# Patient Record
Sex: Male | Born: 1955 | Race: White | Hispanic: No | Marital: Married | Smoking: Never smoker
Health system: Southern US, Community
[De-identification: ages and names within clinical notes are randomized; demographics above are authoritative.]

## PROBLEM LIST (undated history)

## (undated) DIAGNOSIS — D3A019 Benign carcinoid tumor of the small intestine, unspecified portion: Secondary | ICD-10-CM

## (undated) DIAGNOSIS — K432 Incisional hernia without obstruction or gangrene: Secondary | ICD-10-CM

## (undated) DIAGNOSIS — E119 Type 2 diabetes mellitus without complications: Secondary | ICD-10-CM

## (undated) DIAGNOSIS — B3781 Candidal esophagitis: Secondary | ICD-10-CM

## (undated) DIAGNOSIS — K219 Gastro-esophageal reflux disease without esophagitis: Secondary | ICD-10-CM

## (undated) DIAGNOSIS — C439 Malignant melanoma of skin, unspecified: Secondary | ICD-10-CM

## (undated) DIAGNOSIS — R519 Headache, unspecified: Secondary | ICD-10-CM

## (undated) DIAGNOSIS — K222 Esophageal obstruction: Secondary | ICD-10-CM

## (undated) DIAGNOSIS — G8929 Other chronic pain: Secondary | ICD-10-CM

## (undated) DIAGNOSIS — I10 Essential (primary) hypertension: Secondary | ICD-10-CM

## (undated) DIAGNOSIS — M1990 Unspecified osteoarthritis, unspecified site: Secondary | ICD-10-CM

## (undated) DIAGNOSIS — K635 Polyp of colon: Secondary | ICD-10-CM

## (undated) DIAGNOSIS — E785 Hyperlipidemia, unspecified: Secondary | ICD-10-CM

## (undated) DIAGNOSIS — R51 Headache: Secondary | ICD-10-CM

## (undated) HISTORY — DX: Unspecified osteoarthritis, unspecified site: M19.90

## (undated) HISTORY — DX: Gastro-esophageal reflux disease without esophagitis: K21.9

## (undated) HISTORY — DX: Malignant melanoma of skin, unspecified: C43.9

## (undated) HISTORY — PX: COLON RESECTION: SHX5231

## (undated) HISTORY — DX: Other chronic pain: G89.29

## (undated) HISTORY — DX: Headache: R51

## (undated) HISTORY — DX: Hyperlipidemia, unspecified: E78.5

## (undated) HISTORY — DX: Headache, unspecified: R51.9

## (undated) HISTORY — DX: Essential (primary) hypertension: I10

## (undated) HISTORY — DX: Esophageal obstruction: K22.2

## (undated) HISTORY — DX: Type 2 diabetes mellitus without complications: E11.9

## (undated) HISTORY — DX: Incisional hernia without obstruction or gangrene: K43.2

## (undated) HISTORY — DX: Candidal esophagitis: B37.81

## (undated) HISTORY — DX: Polyp of colon: K63.5

## (undated) HISTORY — PX: HERNIA REPAIR: SHX51

## (undated) HISTORY — PX: OTHER SURGICAL HISTORY: SHX169

## (undated) HISTORY — DX: Benign carcinoid tumor of the small intestine, unspecified portion: D3A.019

---

## 1993-12-23 DIAGNOSIS — C439 Malignant melanoma of skin, unspecified: Secondary | ICD-10-CM

## 1993-12-23 HISTORY — DX: Malignant melanoma of skin, unspecified: C43.9

## 2000-05-29 ENCOUNTER — Ambulatory Visit (HOSPITAL_COMMUNITY): Admission: RE | Admit: 2000-05-29 | Discharge: 2000-05-29 | Payer: Self-pay | Admitting: Oncology

## 2000-05-29 ENCOUNTER — Encounter: Payer: Self-pay | Admitting: Oncology

## 2000-11-07 ENCOUNTER — Encounter: Admission: RE | Admit: 2000-11-07 | Discharge: 2000-11-07 | Payer: Self-pay | Admitting: Oncology

## 2000-11-07 ENCOUNTER — Encounter: Payer: Self-pay | Admitting: Oncology

## 2000-11-12 ENCOUNTER — Encounter: Payer: Self-pay | Admitting: Oncology

## 2000-11-12 ENCOUNTER — Encounter: Admission: RE | Admit: 2000-11-12 | Discharge: 2000-11-12 | Payer: Self-pay | Admitting: Oncology

## 2000-11-17 ENCOUNTER — Encounter: Admission: RE | Admit: 2000-11-17 | Discharge: 2000-11-17 | Payer: Self-pay | Admitting: Oncology

## 2000-11-17 ENCOUNTER — Encounter: Payer: Self-pay | Admitting: Oncology

## 2000-11-21 ENCOUNTER — Encounter: Payer: Self-pay | Admitting: Oncology

## 2000-11-21 ENCOUNTER — Encounter: Admission: RE | Admit: 2000-11-21 | Discharge: 2000-11-21 | Payer: Self-pay | Admitting: Oncology

## 2000-11-25 ENCOUNTER — Encounter: Payer: Self-pay | Admitting: Oncology

## 2000-11-25 ENCOUNTER — Ambulatory Visit (HOSPITAL_COMMUNITY): Admission: RE | Admit: 2000-11-25 | Discharge: 2000-11-25 | Payer: Self-pay | Admitting: Oncology

## 2001-01-22 ENCOUNTER — Inpatient Hospital Stay (HOSPITAL_COMMUNITY): Admission: RE | Admit: 2001-01-22 | Discharge: 2001-01-23 | Payer: Self-pay | Admitting: Surgery

## 2001-09-15 ENCOUNTER — Ambulatory Visit (HOSPITAL_COMMUNITY): Admission: RE | Admit: 2001-09-15 | Discharge: 2001-09-15 | Payer: Self-pay | Admitting: Oncology

## 2001-09-15 ENCOUNTER — Encounter: Payer: Self-pay | Admitting: Oncology

## 2002-03-09 ENCOUNTER — Encounter: Payer: Self-pay | Admitting: Oncology

## 2002-03-09 ENCOUNTER — Ambulatory Visit (HOSPITAL_COMMUNITY): Admission: RE | Admit: 2002-03-09 | Discharge: 2002-03-09 | Payer: Self-pay | Admitting: Oncology

## 2002-10-04 ENCOUNTER — Encounter: Payer: Self-pay | Admitting: Oncology

## 2002-10-04 ENCOUNTER — Ambulatory Visit (HOSPITAL_COMMUNITY): Admission: RE | Admit: 2002-10-04 | Discharge: 2002-10-04 | Payer: Self-pay | Admitting: Oncology

## 2003-06-14 ENCOUNTER — Encounter: Payer: Self-pay | Admitting: Oncology

## 2003-06-14 ENCOUNTER — Ambulatory Visit (HOSPITAL_COMMUNITY): Admission: RE | Admit: 2003-06-14 | Discharge: 2003-06-14 | Payer: Self-pay | Admitting: Oncology

## 2003-06-16 ENCOUNTER — Encounter: Admission: RE | Admit: 2003-06-16 | Discharge: 2003-06-16 | Payer: Self-pay | Admitting: Internal Medicine

## 2004-06-05 ENCOUNTER — Ambulatory Visit (HOSPITAL_COMMUNITY): Admission: RE | Admit: 2004-06-05 | Discharge: 2004-06-05 | Payer: Self-pay | Admitting: Oncology

## 2005-03-04 ENCOUNTER — Ambulatory Visit: Payer: Self-pay | Admitting: Oncology

## 2005-06-24 ENCOUNTER — Ambulatory Visit: Payer: Self-pay | Admitting: Oncology

## 2005-06-26 ENCOUNTER — Ambulatory Visit (HOSPITAL_COMMUNITY): Admission: RE | Admit: 2005-06-26 | Discharge: 2005-06-26 | Payer: Self-pay | Admitting: Oncology

## 2005-07-03 ENCOUNTER — Ambulatory Visit (HOSPITAL_COMMUNITY): Admission: RE | Admit: 2005-07-03 | Discharge: 2005-07-03 | Payer: Self-pay | Admitting: Oncology

## 2005-12-24 ENCOUNTER — Ambulatory Visit: Payer: Self-pay | Admitting: Oncology

## 2005-12-25 ENCOUNTER — Ambulatory Visit (HOSPITAL_COMMUNITY): Admission: RE | Admit: 2005-12-25 | Discharge: 2005-12-25 | Payer: Self-pay | Admitting: Oncology

## 2006-01-09 ENCOUNTER — Ambulatory Visit (HOSPITAL_COMMUNITY): Admission: RE | Admit: 2006-01-09 | Discharge: 2006-01-09 | Payer: Self-pay | Admitting: Oncology

## 2006-01-13 ENCOUNTER — Ambulatory Visit: Payer: Self-pay | Admitting: Gastroenterology

## 2006-02-03 ENCOUNTER — Ambulatory Visit: Payer: Self-pay | Admitting: Gastroenterology

## 2006-06-23 ENCOUNTER — Ambulatory Visit: Payer: Self-pay | Admitting: Oncology

## 2006-06-23 LAB — CBC WITH DIFFERENTIAL/PLATELET
EOS%: 1.6 % (ref 0.0–7.0)
Eosinophils Absolute: 0.1 10*3/uL (ref 0.0–0.5)
LYMPH%: 25 % (ref 14.0–48.0)
MCH: 30.5 pg (ref 28.0–33.4)
MCHC: 34.1 g/dL (ref 32.0–35.9)
MCV: 89.4 fL (ref 81.6–98.0)
MONO%: 10.1 % (ref 0.0–13.0)
Platelets: 227 10*3/uL (ref 145–400)
RBC: 4.57 10*6/uL (ref 4.20–5.71)
RDW: 13.6 % (ref 11.2–14.6)

## 2006-06-23 LAB — COMPREHENSIVE METABOLIC PANEL
ALT: 43 U/L — ABNORMAL HIGH (ref 0–40)
AST: 24 U/L (ref 0–37)
Alkaline Phosphatase: 38 U/L — ABNORMAL LOW (ref 39–117)
Calcium: 9.4 mg/dL (ref 8.4–10.5)
Chloride: 102 mEq/L (ref 96–112)
Creatinine, Ser: 1.29 mg/dL (ref 0.40–1.50)

## 2006-06-30 LAB — 5 HIAA, QUANTITATIVE, URINE, 24 HOUR
5-HIAA, 24 Hr Urine: 5 mg/d (ref 0–15)
Creatinine, Urine mg/day: 2535 mg/d — ABNORMAL HIGH (ref 1000–2500)
Creatinine, Urine-mg/dL-5HIAA: 156 mg/dL
Interpretation: NORMAL
Time-5HIAA: 24 hr
Volume, Urine-5HIAA: 1625 mL

## 2006-06-30 LAB — CREATININE CLEARANCE, URINE, 24 HOUR
Collection Interval-CRCL: 24 hours
Creatinine Clearance: 158 mL/min — ABNORMAL HIGH (ref 75–125)

## 2006-12-18 ENCOUNTER — Ambulatory Visit: Payer: Self-pay | Admitting: Oncology

## 2006-12-24 ENCOUNTER — Ambulatory Visit (HOSPITAL_COMMUNITY): Admission: RE | Admit: 2006-12-24 | Discharge: 2006-12-24 | Payer: Self-pay | Admitting: Oncology

## 2006-12-24 LAB — COMPREHENSIVE METABOLIC PANEL
ALT: 76 U/L — ABNORMAL HIGH (ref 0–53)
CO2: 29 mEq/L (ref 19–32)
Calcium: 9.7 mg/dL (ref 8.4–10.5)
Chloride: 99 mEq/L (ref 96–112)
Creatinine, Ser: 1.13 mg/dL (ref 0.40–1.50)
Glucose, Bld: 262 mg/dL — ABNORMAL HIGH (ref 70–99)
Total Bilirubin: 0.8 mg/dL (ref 0.3–1.2)

## 2006-12-24 LAB — CBC WITH DIFFERENTIAL/PLATELET
Basophils Absolute: 0.1 10*3/uL (ref 0.0–0.1)
Eosinophils Absolute: 0.1 10*3/uL (ref 0.0–0.5)
HCT: 43.1 % (ref 38.7–49.9)
HGB: 14.9 g/dL (ref 13.0–17.1)
LYMPH%: 20.6 % (ref 14.0–48.0)
MONO#: 1 10*3/uL — ABNORMAL HIGH (ref 0.1–0.9)
NEUT#: 4 10*3/uL (ref 1.5–6.5)
NEUT%: 61.1 % (ref 40.0–75.0)
Platelets: 226 10*3/uL (ref 145–400)
WBC: 6.6 10*3/uL (ref 4.0–10.0)
lymph#: 1.4 10*3/uL (ref 0.9–3.3)

## 2006-12-24 LAB — LACTATE DEHYDROGENASE: LDH: 224 U/L (ref 94–250)

## 2007-01-08 ENCOUNTER — Ambulatory Visit (HOSPITAL_COMMUNITY): Admission: RE | Admit: 2007-01-08 | Discharge: 2007-01-08 | Payer: Self-pay | Admitting: Oncology

## 2007-06-19 ENCOUNTER — Ambulatory Visit: Payer: Self-pay | Admitting: Oncology

## 2007-06-23 ENCOUNTER — Ambulatory Visit (HOSPITAL_COMMUNITY): Admission: RE | Admit: 2007-06-23 | Discharge: 2007-06-23 | Payer: Self-pay | Admitting: Oncology

## 2007-06-23 LAB — LACTATE DEHYDROGENASE: LDH: 183 U/L (ref 94–250)

## 2007-06-23 LAB — CBC WITH DIFFERENTIAL/PLATELET
BASO%: 0.4 % (ref 0.0–2.0)
Basophils Absolute: 0 10*3/uL (ref 0.0–0.1)
Eosinophils Absolute: 0.1 10*3/uL (ref 0.0–0.5)
HCT: 43.1 % (ref 38.7–49.9)
HGB: 15 g/dL (ref 13.0–17.1)
MONO#: 0.8 10*3/uL (ref 0.1–0.9)
NEUT#: 4.2 10*3/uL (ref 1.5–6.5)
NEUT%: 64.8 % (ref 40.0–75.0)
Platelets: 204 10*3/uL (ref 145–400)
WBC: 6.4 10*3/uL (ref 4.0–10.0)
lymph#: 1.3 10*3/uL (ref 0.9–3.3)

## 2007-06-23 LAB — COMPREHENSIVE METABOLIC PANEL
ALT: 36 U/L (ref 0–53)
BUN: 24 mg/dL — ABNORMAL HIGH (ref 6–23)
CO2: 27 mEq/L (ref 19–32)
Calcium: 9.5 mg/dL (ref 8.4–10.5)
Chloride: 104 mEq/L (ref 96–112)
Creatinine, Ser: 1.07 mg/dL (ref 0.40–1.50)
Glucose, Bld: 141 mg/dL — ABNORMAL HIGH (ref 70–99)

## 2007-06-30 LAB — UA PROTEIN, DIPSTICK - CHCC: Protein, Urine: NEGATIVE mg/dL

## 2007-08-16 IMAGING — CT CT ABDOMEN W/ CM
2 of 5 series · 15 of 46 positions shown, 17 images · IV contrast (omnipaque)
Comparison: Report of a prior study in May 2000.

CLINICAL DATA: Carcinoid lesion and melanoma.  Elevated liver function studies. 
ABDOMEN CT WITH CONTRAST:
TECHNIQUE: Multidetector CT imaging of the abdomen was performed following the standard protocol during bolus administration of intravenous contrast.  This patient has a known history of contrast allergy.  He was premedicated with steroids and Benadryl per our routine 13 hour prep.  Several days earlier he was premedicated with a different protocol, but that procedure was canceled and he was brought in this morning after our routine 13 hour prep.  Despite the prep, the patient developed a significant urticarial reaction over his face and chest following the procedure.  We were successful in completing the scan.  
I was asked to see Mr. Chaviano at that time.  He had numerous hives over his face and chest.  Conjunctiva were injected and red.  He was not short of breath.  Vital signs were stable with blood pressure about 130/108 and heart rate 110 beats per minute.  On physical exam he has no wheezing.  The patient was given an additional 25 mg of Benadryl orally, and was observed in our department for several hours until all symptoms had resolved.  
Mr. Chaviano stated to us that each time he has had IV contrast for CT, he has had a similar reaction.  He has had a total of four allergic reactions to contrast.  The only other time we have scanned him, was in May 2000 when we documented an urticarial reaction similar to today?s .  He stated that on one occasion in the past, he also had shortness of breath.  
This patient clearly has a significant allergy to IV contrast.  Although he has not had a major reaction, he has the propensity to do so.  I think it would be prudent not to give this patient IV contrast in the future, unless it is absolutely necessary.  If the decision is made that he needs contrast, it should be done in a hospital setting, during first shift when full facilities are available.  Although the routine 13 hour prep has been shown to decrease minor reactions, I know of no studies showing that any pre-med protocol can reduce or eliminate the risk of major reactions.  This was explained to the patient.  I also spoke with Dr. Believers and made him aware of the patient?s contrast reaction. 
Contrast:  Oral contrast and 125 cc Omnipaque 300
TECHNIQUE: Multidetector CT imaging of the pelvis was performed following the standard protocol during bolus administration of intravenous contrast.

[Series 2: abd_pel 5.0 b40s · axial · 0.93mm/px · z∈[-594,-144]mm · 12 of 101 slices shown, 14 images]
[im 6/101  soft-tissue]
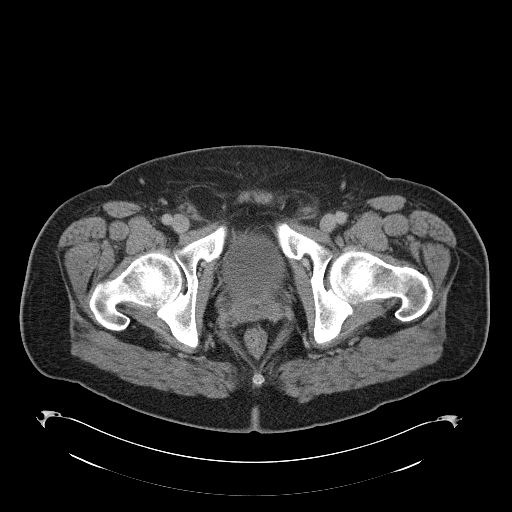
[im 6/101  bone]
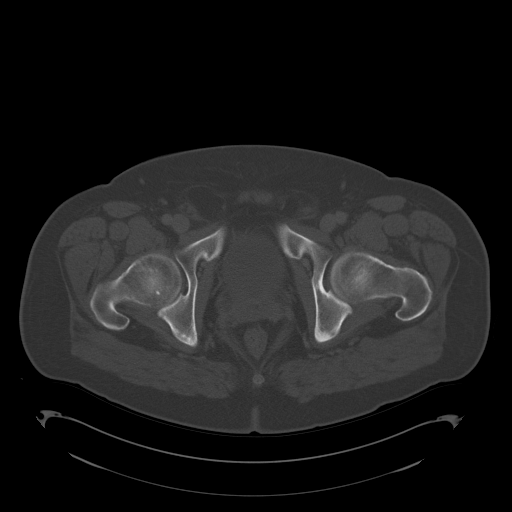
[im 16/101  soft-tissue]
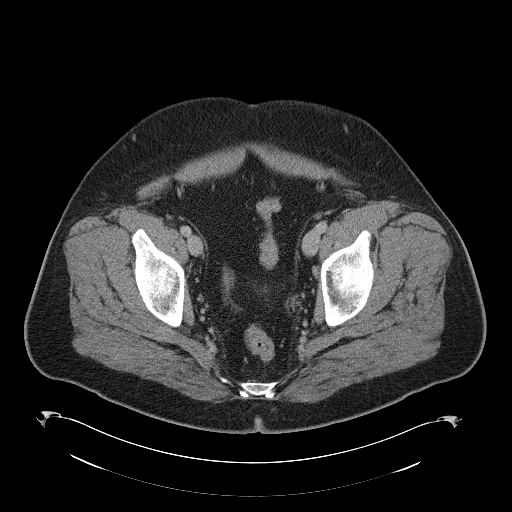
[im 21/101  soft-tissue]
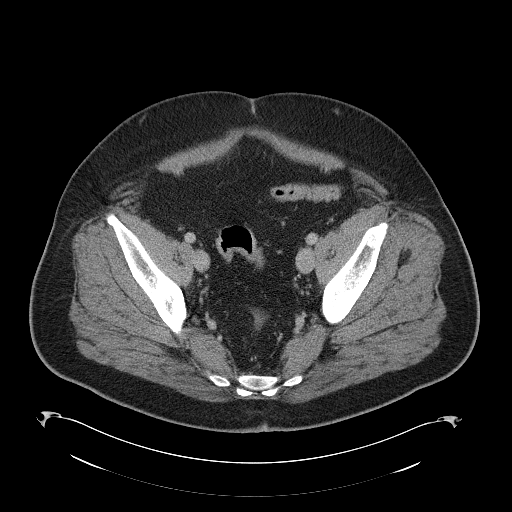
[im 31/101  soft-tissue]
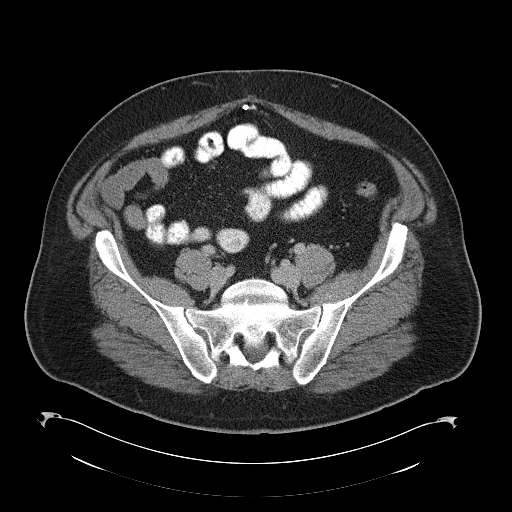
[im 41/101  soft-tissue]
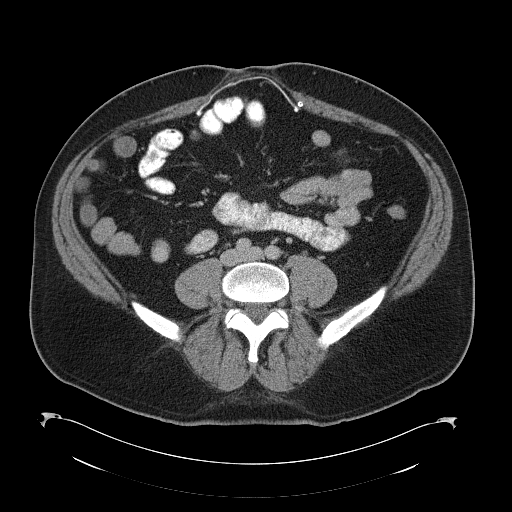
[im 46/101  soft-tissue]
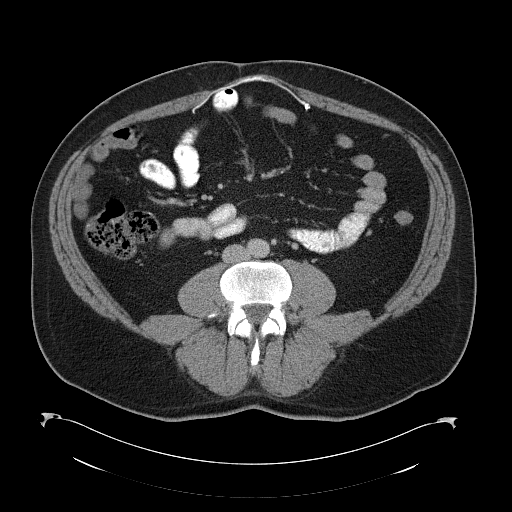
[im 56/101  soft-tissue]
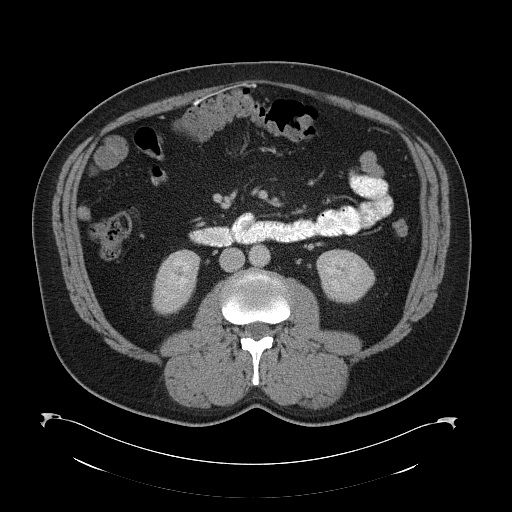
[im 61/101  soft-tissue]
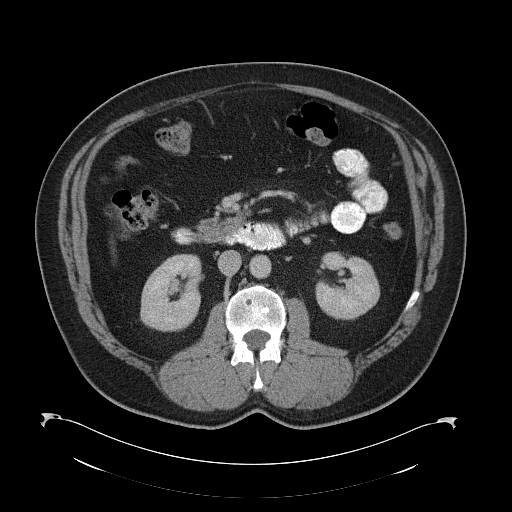
[im 71/101  soft-tissue]
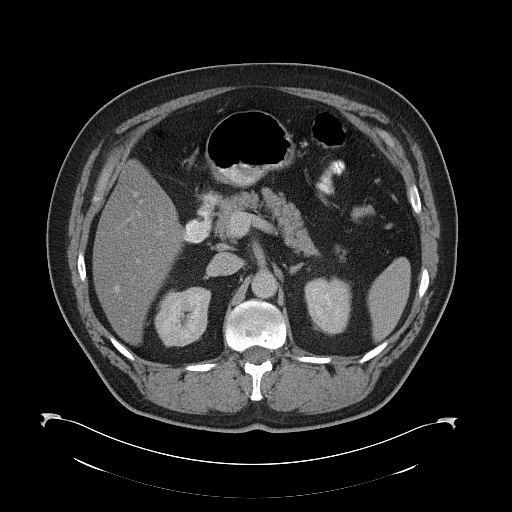
[im 71/101  bone]
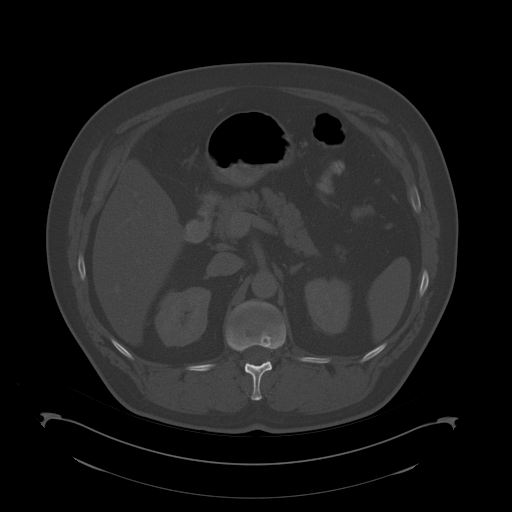
[im 81/101  soft-tissue]
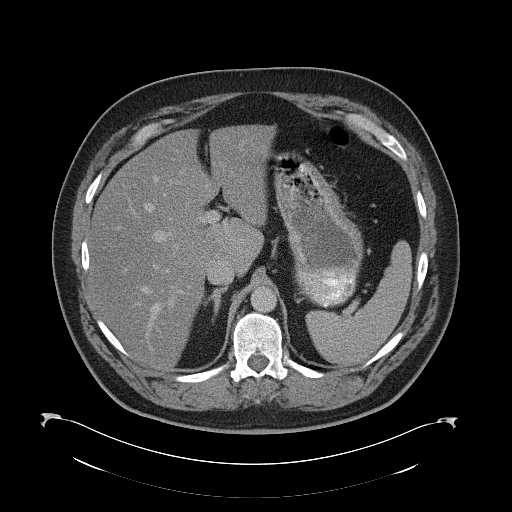
[im 86/101  soft-tissue]
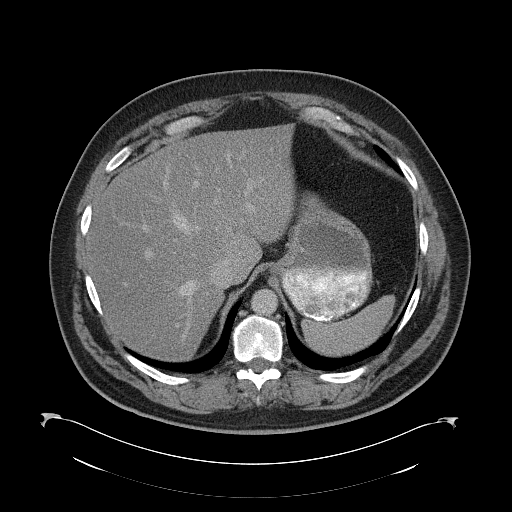
[im 96/101  soft-tissue]
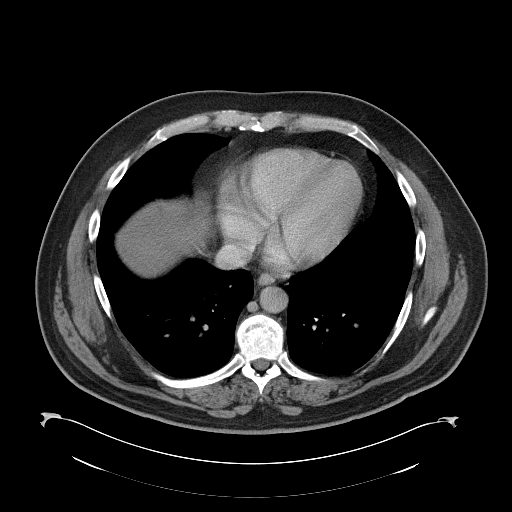

[Series 602: coronal images · coronal · 0.98mm/px · 3 of 103 slices shown]
[im 35/103  soft-tissue]
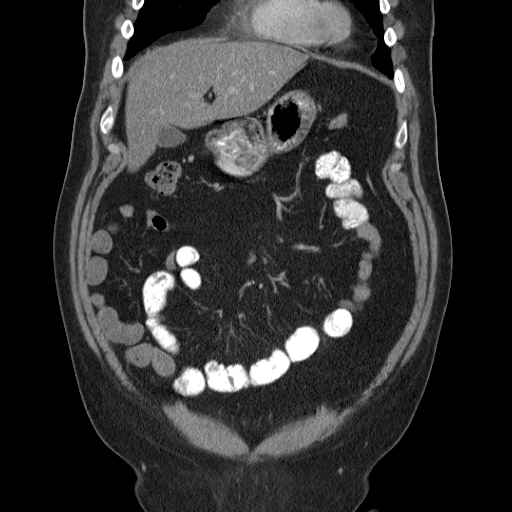
[im 46/103  soft-tissue]
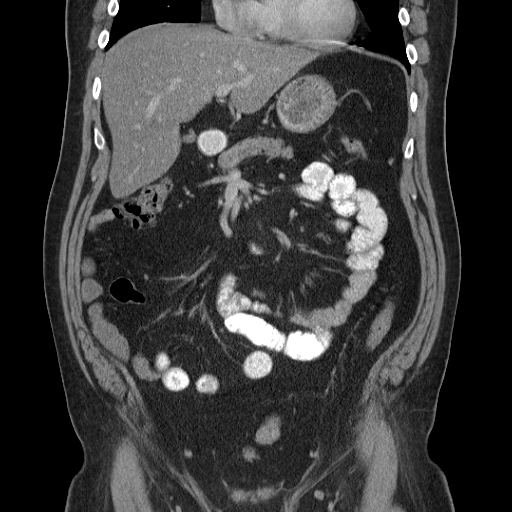
[im 57/103  soft-tissue]
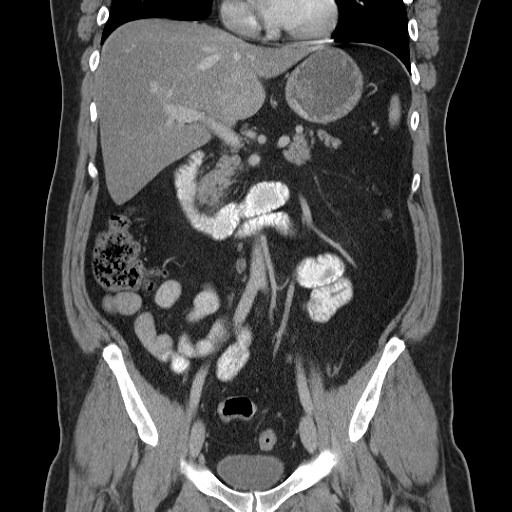

[15 of 46 positions shown; findings below may reference images not displayed]

FINDINGS: Highest cuts include the lung bases which are clear.  There is diffuse fatty infiltration of the liver which was described on the prior study in 0112.  There are no focal lesions.  The spleen, pancreas, and adrenal glands normal.  Early and delayed images through the kidneys show no masses or obstruction.  No adenopathy or ascites.  
There are multiple Schmorl's node lesions in the spine as previously reported.
IMPRESSION: 1.  Normal except for fatty infiltration of the liver. 
2.  Contrast reaction despite premedication ? see above discussion under technique. 
PELVIS CT WITH CONTRAST:
FINDINGS: No focal masses, adenopathy, or fluid collections.  Pelvic side walls and presacral space normal.  No lesions of the bony pelvis.  Appendix not visualized.  There is anterior ventral hernia repair with mesh spanning the midline.  Also noted is a fat-filled right inguinal hernia.
IMPRESSION: No evidence for metastatic disease or other significant findings ? the patient has had a prior ventral hernia repair.

## 2008-06-21 ENCOUNTER — Ambulatory Visit: Payer: Self-pay | Admitting: Oncology

## 2008-06-23 ENCOUNTER — Ambulatory Visit (HOSPITAL_COMMUNITY): Admission: RE | Admit: 2008-06-23 | Discharge: 2008-06-23 | Payer: Self-pay | Admitting: Oncology

## 2008-06-23 LAB — CBC WITH DIFFERENTIAL/PLATELET
BASO%: 0.6 % (ref 0.0–2.0)
Basophils Absolute: 0 10*3/uL (ref 0.0–0.1)
EOS%: 2.2 % (ref 0.0–7.0)
HGB: 14.4 g/dL (ref 13.0–17.1)
MCH: 29.8 pg (ref 28.0–33.4)
MONO%: 13.7 % — ABNORMAL HIGH (ref 0.0–13.0)
RBC: 4.86 10*6/uL (ref 4.20–5.71)
RDW: 14.2 % (ref 11.2–14.6)
lymph#: 2.1 10*3/uL (ref 0.9–3.3)

## 2008-06-23 LAB — COMPREHENSIVE METABOLIC PANEL
ALT: 32 U/L (ref 0–53)
AST: 24 U/L (ref 0–37)
Albumin: 4.8 g/dL (ref 3.5–5.2)
Alkaline Phosphatase: 39 U/L (ref 39–117)
BUN: 24 mg/dL — ABNORMAL HIGH (ref 6–23)
Calcium: 9.8 mg/dL (ref 8.4–10.5)
Chloride: 104 mEq/L (ref 96–112)
Potassium: 3.9 mEq/L (ref 3.5–5.3)
Sodium: 142 mEq/L (ref 135–145)
Total Protein: 7.3 g/dL (ref 6.0–8.3)

## 2008-06-28 ENCOUNTER — Encounter: Payer: Self-pay | Admitting: Gastroenterology

## 2008-12-22 ENCOUNTER — Ambulatory Visit: Payer: Self-pay | Admitting: Oncology

## 2008-12-27 ENCOUNTER — Ambulatory Visit (HOSPITAL_COMMUNITY): Admission: RE | Admit: 2008-12-27 | Discharge: 2008-12-27 | Payer: Self-pay | Admitting: Oncology

## 2008-12-27 LAB — CBC WITH DIFFERENTIAL/PLATELET
Eosinophils Absolute: 0.1 10*3/uL (ref 0.0–0.5)
HCT: 42 % (ref 38.7–49.9)
HGB: 14.3 g/dL (ref 13.0–17.1)
LYMPH%: 26.3 % (ref 14.0–48.0)
MONO#: 0.8 10*3/uL (ref 0.1–0.9)
NEUT#: 3.8 10*3/uL (ref 1.5–6.5)
NEUT%: 59.2 % (ref 40.0–75.0)
Platelets: 215 10*3/uL (ref 145–400)
WBC: 6.4 10*3/uL (ref 4.0–10.0)

## 2008-12-29 LAB — CHROMOGRANIN A: Chromogranin A: <1.5 ng/mL (ref <=36.4)

## 2008-12-29 LAB — COMPREHENSIVE METABOLIC PANEL
CO2: 29 mEq/L (ref 19–32)
Calcium: 9.2 mg/dL (ref 8.4–10.5)
Chloride: 105 mEq/L (ref 96–112)
Creatinine, Ser: 1.06 mg/dL (ref 0.40–1.50)
Glucose, Bld: 75 mg/dL (ref 70–99)
Total Bilirubin: 0.5 mg/dL (ref 0.3–1.2)
Total Protein: 7 g/dL (ref 6.0–8.3)

## 2008-12-29 LAB — LACTATE DEHYDROGENASE: LDH: 200 U/L (ref 94–250)

## 2009-01-03 ENCOUNTER — Encounter: Payer: Self-pay | Admitting: Gastroenterology

## 2009-12-21 ENCOUNTER — Ambulatory Visit: Payer: Self-pay | Admitting: Oncology

## 2009-12-26 ENCOUNTER — Ambulatory Visit (HOSPITAL_COMMUNITY): Admission: RE | Admit: 2009-12-26 | Discharge: 2009-12-26 | Payer: Self-pay | Admitting: Oncology

## 2009-12-26 LAB — CBC WITH DIFFERENTIAL/PLATELET
Eosinophils Absolute: 0.1 10*3/uL (ref 0.0–0.5)
MONO#: 0.6 10*3/uL (ref 0.1–0.9)
MONO%: 11.1 % (ref 0.0–14.0)
NEUT#: 3.1 10*3/uL (ref 1.5–6.5)
RBC: 4.52 10*6/uL (ref 4.20–5.82)
RDW: 13.9 % (ref 11.0–14.6)
WBC: 5.3 10*3/uL (ref 4.0–10.3)
lymph#: 1.5 10*3/uL (ref 0.9–3.3)

## 2010-01-02 LAB — COMPREHENSIVE METABOLIC PANEL
Albumin: 4.3 g/dL (ref 3.5–5.2)
Alkaline Phosphatase: 39 U/L (ref 39–117)
CO2: 28 mEq/L (ref 19–32)
Chloride: 102 mEq/L (ref 96–112)
Glucose, Bld: 69 mg/dL — ABNORMAL LOW (ref 70–99)
Potassium: 4.1 mEq/L (ref 3.5–5.3)
Sodium: 142 mEq/L (ref 135–145)
Total Protein: 6.7 g/dL (ref 6.0–8.3)

## 2010-01-02 LAB — CHROMOGRANIN A

## 2010-01-08 ENCOUNTER — Encounter: Payer: Self-pay | Admitting: Gastroenterology

## 2010-01-23 ENCOUNTER — Telehealth: Payer: Self-pay | Admitting: Gastroenterology

## 2010-01-25 ENCOUNTER — Ambulatory Visit: Payer: Self-pay | Admitting: Gastroenterology

## 2010-01-25 ENCOUNTER — Ambulatory Visit (HOSPITAL_COMMUNITY): Admission: RE | Admit: 2010-01-25 | Discharge: 2010-01-25 | Payer: Self-pay | Admitting: Gastroenterology

## 2010-01-25 DIAGNOSIS — C7A029 Malignant carcinoid tumor of the large intestine, unspecified portion: Secondary | ICD-10-CM

## 2010-01-25 DIAGNOSIS — R1084 Generalized abdominal pain: Secondary | ICD-10-CM | POA: Insufficient documentation

## 2010-01-25 DIAGNOSIS — R112 Nausea with vomiting, unspecified: Secondary | ICD-10-CM | POA: Insufficient documentation

## 2010-01-25 DIAGNOSIS — E1149 Type 2 diabetes mellitus with other diabetic neurological complication: Secondary | ICD-10-CM

## 2010-01-26 LAB — CONVERTED CEMR LAB
AST: 28 units/L (ref 0–37)
BUN: 17 mg/dL (ref 6–23)
Basophils Absolute: 0 10*3/uL (ref 0.0–0.1)
Basophils Relative: 0.9 % (ref 0.0–3.0)
Bilirubin, Direct: 0.2 mg/dL (ref 0.0–0.3)
Creatinine, Ser: 1.3 mg/dL (ref 0.4–1.5)
Eosinophils Absolute: 0.1 10*3/uL (ref 0.0–0.7)
Folate: 14.9 ng/mL
GFR calc non Af Amer: 61.7 mL/min (ref >60)
Iron: 80 ug/dL (ref 42–165)
MCHC: 33.1 g/dL (ref 30.0–36.0)
MCV: 93.5 fL (ref 78.0–100.0)
Monocytes Absolute: 0.5 10*3/uL (ref 0.1–1.0)
Neutrophils Relative %: 59.6 % (ref 43.0–77.0)
Potassium: 3.6 meq/L (ref 3.5–5.1)
RBC: 4.33 M/uL (ref 4.22–5.81)
RDW: 12.9 % (ref 11.5–14.6)
Sed Rate: 10 mm/hr (ref 0–22)
TSH: 1.62 microintl units/mL (ref 0.35–5.50)
Total Bilirubin: 0.6 mg/dL (ref 0.3–1.2)
Total CK: 555 units/L — ABNORMAL HIGH (ref 7–232)
Transferrin: 256.5 mg/dL (ref 212.0–360.0)
Vitamin B-12: 334 pg/mL (ref 211–911)

## 2010-01-29 ENCOUNTER — Encounter: Payer: Self-pay | Admitting: Gastroenterology

## 2010-01-30 ENCOUNTER — Encounter (INDEPENDENT_AMBULATORY_CARE_PROVIDER_SITE_OTHER): Payer: Self-pay | Admitting: *Deleted

## 2010-01-30 ENCOUNTER — Ambulatory Visit: Payer: Self-pay | Admitting: Gastroenterology

## 2010-01-30 DIAGNOSIS — C4359 Malignant melanoma of other part of trunk: Secondary | ICD-10-CM | POA: Insufficient documentation

## 2010-01-31 ENCOUNTER — Ambulatory Visit: Payer: Self-pay | Admitting: Gastroenterology

## 2010-02-02 ENCOUNTER — Encounter: Payer: Self-pay | Admitting: Gastroenterology

## 2010-02-15 ENCOUNTER — Telehealth: Payer: Self-pay | Admitting: Gastroenterology

## 2010-07-19 ENCOUNTER — Encounter: Admission: RE | Admit: 2010-07-19 | Discharge: 2010-07-19 | Payer: Self-pay | Admitting: Family Medicine

## 2010-12-27 ENCOUNTER — Ambulatory Visit: Payer: Self-pay | Admitting: Oncology

## 2010-12-31 ENCOUNTER — Ambulatory Visit (HOSPITAL_COMMUNITY)
Admission: RE | Admit: 2010-12-31 | Discharge: 2010-12-31 | Payer: Self-pay | Source: Home / Self Care | Attending: Oncology | Admitting: Oncology

## 2010-12-31 LAB — CBC WITH DIFFERENTIAL/PLATELET
BASO%: 0.9 % (ref 0.0–2.0)
Basophils Absolute: 0 10*3/uL (ref 0.0–0.1)
EOS%: 1.8 % (ref 0.0–7.0)
Eosinophils Absolute: 0.1 10*3/uL (ref 0.0–0.5)
HCT: 42.2 % (ref 38.4–49.9)
HGB: 14.4 g/dL (ref 13.0–17.1)
LYMPH%: 24.4 % (ref 14.0–49.0)
MCH: 30 pg (ref 27.2–33.4)
MCHC: 34.2 g/dL (ref 32.0–36.0)
MCV: 87.7 fL (ref 79.3–98.0)
MONO#: 0.5 10*3/uL (ref 0.1–0.9)
MONO%: 10.6 % (ref 0.0–14.0)
NEUT#: 3.2 10*3/uL (ref 1.5–6.5)
NEUT%: 62.3 % (ref 39.0–75.0)
Platelets: 191 10*3/uL (ref 140–400)
RBC: 4.81 10*6/uL (ref 4.20–5.82)
RDW: 13.2 % (ref 11.0–14.6)
WBC: 5.1 10*3/uL (ref 4.0–10.3)
lymph#: 1.2 10*3/uL (ref 0.9–3.3)

## 2011-01-04 LAB — COMPREHENSIVE METABOLIC PANEL
ALT: 21 U/L (ref 0–53)
AST: 19 U/L (ref 0–37)
Albumin: 4.3 g/dL (ref 3.5–5.2)
Alkaline Phosphatase: 45 U/L (ref 39–117)
BUN: 20 mg/dL (ref 6–23)
CO2: 26 mEq/L (ref 19–32)
Calcium: 9.6 mg/dL (ref 8.4–10.5)
Chloride: 101 mEq/L (ref 96–112)
Creatinine, Ser: 1.08 mg/dL (ref 0.40–1.50)
Glucose, Bld: 134 mg/dL — ABNORMAL HIGH (ref 70–99)
Potassium: 4.1 mEq/L (ref 3.5–5.3)
Sodium: 138 mEq/L (ref 135–145)
Total Bilirubin: 0.5 mg/dL (ref 0.3–1.2)
Total Protein: 6.6 g/dL (ref 6.0–8.3)

## 2011-01-04 LAB — CHROMOGRANIN A: Chromogranin A: 8.6 ng/mL (ref 1.9–15.0)

## 2011-01-04 LAB — LACTATE DEHYDROGENASE: LDH: 174 U/L (ref 94–250)

## 2011-01-13 ENCOUNTER — Encounter: Payer: Self-pay | Admitting: Oncology

## 2011-01-15 ENCOUNTER — Encounter: Payer: Self-pay | Admitting: Gastroenterology

## 2011-01-15 ENCOUNTER — Inpatient Hospital Stay (HOSPITAL_COMMUNITY)
Admission: RE | Admit: 2011-01-15 | Discharge: 2011-01-17 | Payer: Self-pay | Source: Home / Self Care | Attending: Oncology | Admitting: Oncology

## 2011-01-16 LAB — COMPREHENSIVE METABOLIC PANEL WITH GFR
ALT: 29 U/L (ref 0–53)
AST: 19 U/L (ref 0–37)
Albumin: 3.6 g/dL (ref 3.5–5.2)
Alkaline Phosphatase: 48 U/L (ref 39–117)
BUN: 22 mg/dL (ref 6–23)
CO2: 29 meq/L (ref 19–32)
Calcium: 9.5 mg/dL (ref 8.4–10.5)
Chloride: 101 meq/L (ref 96–112)
Creatinine, Ser: 1.14 mg/dL (ref 0.4–1.5)
GFR calc non Af Amer: >60 mL/min
Glucose, Bld: 202 mg/dL — ABNORMAL HIGH (ref 70–99)
Potassium: 4 meq/L (ref 3.5–5.1)
Sodium: 136 meq/L (ref 135–145)
Total Bilirubin: 0.8 mg/dL (ref 0.3–1.2)
Total Protein: 6.5 g/dL (ref 6.0–8.3)

## 2011-01-16 LAB — GLUCOSE, CAPILLARY
Glucose-Capillary: 181 mg/dL — ABNORMAL HIGH (ref 70–99)
Glucose-Capillary: 218 mg/dL — ABNORMAL HIGH (ref 70–99)
Glucose-Capillary: 207 mg/dL — ABNORMAL HIGH (ref 70–99)
Glucose-Capillary: 215 mg/dL — ABNORMAL HIGH (ref 70–99)
Glucose-Capillary: 209 mg/dL — ABNORMAL HIGH (ref 70–99)

## 2011-01-16 LAB — HEMOGLOBIN A1C
Hgb A1c MFr Bld: 7.2 % — ABNORMAL HIGH
Mean Plasma Glucose: 160 mg/dL — ABNORMAL HIGH

## 2011-01-16 LAB — CBC
HCT: 40.6 % (ref 39.0–52.0)
Hemoglobin: 13.7 g/dL (ref 13.0–17.0)
MCH: 29.5 pg (ref 26.0–34.0)
MCHC: 33.7 g/dL (ref 30.0–36.0)
MCV: 87.5 fL (ref 78.0–100.0)
Platelets: 201 K/uL (ref 150–400)
RBC: 4.64 MIL/uL (ref 4.22–5.81)
RDW: 13.4 % (ref 11.5–15.5)
WBC: 5.9 K/uL (ref 4.0–10.5)

## 2011-01-16 LAB — TSH: TSH: 2.21 u[IU]/mL (ref 0.350–4.500)

## 2011-01-16 LAB — VITAMIN B12: Vitamin B-12: 374 pg/mL (ref 211–911)

## 2011-01-16 LAB — RPR: RPR Ser Ql: NONREACTIVE

## 2011-01-16 LAB — FOLATE: Folate: 14.6 ng/mL

## 2011-01-17 LAB — GLUCOSE, CAPILLARY: Glucose-Capillary: 216 mg/dL — ABNORMAL HIGH (ref 70–99)

## 2011-01-17 LAB — LIPID PANEL
HDL: 42 mg/dL (ref >39)
Total CHOL/HDL Ratio: 4.2 RATIO
Triglycerides: 135 mg/dL (ref <150)
VLDL: 27 mg/dL (ref 0–40)

## 2011-01-24 NOTE — Letter (Signed)
Summary: Diabetic Instructions  [facility]  [address] [place], [place] [address]   Phone: [phone]  Fax: [phone]    [name] 05/11/1956 MRN: [id number]   _X  _   ORAL DIABETIC MEDICATION INSTRUCTIONS  The day before your procedure:   Take your diabetic pill as you do normally  The day of your procedure:   Do not take your diabetic pill    We will check your blood sugar levels during the admission process and again in Recovery before discharging you home  ________________________________________________________________________  _ X _   INSULIN (LONG ACTING) MEDICATION INSTRUCTIONS (Lantus, NPH, 70/30, Humulin, Novolin-N)   The day before your procedure:   Take  your regular evening dose    The day of your procedure:   Do not take your morning dose    _  _   INSULIN (SHORT ACTING) MEDICATION INSTRUCTIONS (Regular, Humulog, Novolog)   The day before your procedure:   Do not take your evening dose   The day of your procedure:   Do not take your morning dose   _  _   INSULIN PUMP MEDICATION INSTRUCTIONS  We will contact the physician managing your diabetic care for written dosage instructions for the day before your procedure and the day of your procedure.  Once we have received the instructions, we will contact you.

## 2011-01-24 NOTE — Letter (Signed)
[facility]   Imported By: Sherian Rein 02/01/2010 09:49:02  _____________________________________________________________________  External Attachment:    Type:   Image     Comment:   External Document

## 2011-01-24 NOTE — Miscellaneous (Signed)
Summary: rx for diflucan  Clinical Lists Changes  Medications: Added new medication of DIFLUCAN 100 MG  TABS (FLUCONAZOLE) Take one pill twice a day on day one, then one dailyx2 weeks - Signed Rx of DIFLUCAN 100 MG  TABS (FLUCONAZOLE) Take one pill twice a day on day one, then one dailyx2 weeks;  #25 x 2;  Signed;  Entered by: Oda Cogan;  Authorized by: Mardella Layman MD [facility];  Method used: Print then Give to Patient    Prescriptions: DIFLUCAN 100 MG  TABS (FLUCONAZOLE) Take one pill twice a day on day one, then one dailyx2 weeks  #25 x 2   Entered by:   Oda Cogan   Authorized by:   Mardella Layman MD [facility]   Signed by:   Oda Cogan on 01/31/2010   Method used:   Print then Give to Patient   RxID:   [id number]

## 2011-01-24 NOTE — Letter (Signed)
Summary: Patient [facility] Biopsy Results  [facility]  [address] [place], [place] [address]   Phone: [phone]  Fax: [phone]        February 02, 2010 MRN: [id number]    [name] [address] [place], [place]  [address]    Dear Mr. Coale,  I am pleased to inform you that the biopsies taken during your recent endoscopic examination did not show any evidence of cancer upon pathologic examination.  Additional information/recommendations:  __No further action is needed at this time.  Please follow-up with      your primary care physician for your other healthcare needs.  __ Please call [phone] to schedule a return visit to review      your condition.  _XX_ Continue with the treatment plan as outlined on the day of your      exam.  __ You should have a repeat endoscopic examination for this problem              in _ months/years.   Please call us if you are having persistent problems or have questions about your condition that have not been fully answered at this time.  Sincerely,  Mardella Layman MD [facility]  This letter has been electronically signed by your physician.  Appended Document: Patient Notice-Endo Biopsy Results Letter mailed 2.15.11

## 2011-01-24 NOTE — Progress Notes (Signed)
Summary: 2wk Update on his condition  Phone Note Call from Patient Call back at [phone]   Call For: Dr Jarold Motto Summary of Call: Is feeling better but has the preassure in his stomach when he eats. Is better than before Endo ans will advice [place] if it gets worse. Initial call taken by: Leanor Kail [facility],  February 15, 2010 11:03 AM  Follow-up for Phone Call        Pt called to reoprt.  He is feeling some better.  Only has pain and pressure if eats too much.  Pt states he would like to wait on the CT scan.  He is allergic to the contrast.  Pt will report back in one week if OK with Dr. Jarold Motto.  Pt also needs refill on nexium. Follow-up by: Ashok Cordia RN,  February 16, 2010 8:32 AM  Additional Follow-up for Phone Call Additional follow up Details #1::        ok Additional Follow-up by: Mardella Layman MD [facility],  February 16, 2010 9:13 AM    Additional Follow-up for Phone Call Additional follow up Details #2::    Rx for Nexium faxed to pharmacy. Follow-up by: Ashok Cordia RN,  February 16, 2010 9:14 AM  New/Updated Medications: NEXIUM 40 MG  CPDR (ESOMEPRAZOLE MAGNESIUM) 1qd Prescriptions: NEXIUM 40 MG  CPDR (ESOMEPRAZOLE MAGNESIUM) 1qd  #30 x 6   Entered by:   Ashok Cordia RN   Authorized by:   Mardella Layman MD [facility]   Signed by:   Ashok Cordia RN on 02/16/2010   Method used:   Print then Give to Patient   RxID:   [id number]
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
## 2011-01-24 NOTE — Procedures (Signed)
Summary: Upper Endoscopy  Patient: [name] Note: All result statuses are Final unless otherwise noted.  Tests: (1) Upper Endoscopy (EGD)   EGD Upper Endoscopy       DONE     [facility]     [address]     [place], [place]  [address]           ENDOSCOPY PROCEDURE REPORT           PATIENT:  [name], [name]  MR#:  [id number]     BIRTHDATE:  10/06/1956, 53 yrs. old  GENDER:  male           ENDOSCOPIST:  Vania Rea. Jarold Motto, MD, [facility]     Referred by:           PROCEDURE DATE:  01/31/2010     PROCEDURE:  EGD with biopsy     ASA CLASS:  Class II     INDICATIONS:  nausea and vomiting and diffuse abdominal pain.prior     resection for CARCINOID TUMOR OF THE SMALL BOWEL AND ALSO MELANOMA     RESECTION FROM THE ABDOMINAL WALL.           MEDICATIONS:   There was residual sedation effect present from     prior procedure., Versed 3 mg IV, glycopyrrolate (Robinal) 0.2 mg     IV     TOPICAL ANESTHETIC:  Exactacain Spray           DESCRIPTION OF PROCEDURE:   After the risks benefits and     alternatives of the procedure were thoroughly explained, informed     consent was obtained.  The [facility] [id number] [id number] endoscope was     introduced through the mouth and advanced to the second portion of     the duodenum, without limitations.  The instrument was slowly     withdrawn as the mucosa was fully examined.     <<PROCEDUREIMAGES>>           Candida esophagitis. PROXIMAL ESOPHAGUS WITH DENSE WHITE ADHERENT     CANDIDA PLAQUES.  Esophagitis was found. EROSIONS AND EXUDATE ON     STRICTURED AREA AT GE JUNCTION BIOPSIED.5 CM. HH NOTED ALSO.     normal ampulla.  Normal duodenal folds were noted.  The stomach was     entered and closely examined. The antrum, angularis, and lesser     curvature were well visualized, including a retroflexed view of     the cardia and fundus. The stomach wall was normally distensable.     The scope passed easily through the pylorus into the duodenum.     Retroflexed views revealed a hiatal hernia.    The scope was then     withdrawn from the patient and the procedure completed.           COMPLICATIONS:  None           ENDOSCOPIC IMPRESSION:     1) Candida esophagitis     2) Esophagitis     3) Normal ampulla     4) Normal duodenal folds     5) Normal stomach     6) A hiatal hernia     1. GERD AND STRICTURE     2. CANDIDA ESOPHAGITIS     RECOMMENDATIONS:     1) await biopsy results     1. DECREASE NEXIUM TO QAM     2. DIFLUCAN 100 MG BID FOR 1 DAY, THEN 1 A DAY FOR 2  WEEKS     #25,REFILL X 2.     3. CONSIDER CT SCAN.           REPEAT EXAM:  No           ______________________________     Vania Rea. Jarold Motto, MD, [facility]           CC:  [name], PA           n.     eSIGNED:   Vania Rea. Patterson at 01/31/2010 02:41 PM           [facility], [id number]  Note: An exclamation mark (!) indicates a result that was not dispersed into the flowsheet. Document Creation Date: 01/31/2010 7:27 PM _______________________________________________________________________  (1) Order result status: Final Collection or observation date-time: 01/31/2010 14:29 Requested date-time:  Receipt date-time:  Reported date-time:  Referring Physician:   Ordering Physician: Sheryn Bison [phone]) Specimen Source:  Source: Launa Grill Order Number: [id number] Lab site:

## 2011-01-24 NOTE — Procedures (Signed)
Summary: Colonoscopy  Patient: [name] Note: All result statuses are Final unless otherwise noted.  Tests: (1) Colonoscopy (COL)   COL Colonoscopy           DONE     [facility]     [address]     [place], [place]  [address]           COLONOSCOPY PROCEDURE REPORT           PATIENT:  [name], [name]  MR#:  [id number]     BIRTHDATE:  [birth year], 53 yrs. old  GENDER:  male           ENDOSCOPIST:  Vania Rea. Jarold Motto, MD, [facility]     Referred by:           PROCEDURE DATE:  01/31/2010     PROCEDURE:  Colonoscopy with biopsy     ASA CLASS:  Class II     INDICATIONS:  abdominal pain prior resection for CARCINOID TUMOR.                 MEDICATIONS:   Fentanyl 100 mcg IV, Versed 10 mg IV           DESCRIPTION OF PROCEDURE:   After the risks benefits and     alternatives of the procedure were thoroughly explained, informed     consent was obtained.  Digital rectal exam was performed and     revealed no abnormalities.   The [id number] endoscope     was introduced through the anus and advanced to the ileum, without     limitations.  The quality of the prep was excellent, using     MoviPrep.  The instrument was then slowly withdrawn as the colon     was fully examined.     <<PROCEDUREIMAGES>>           FINDINGS:  No polyps or cancers were seen.  There was a normal     ileo-colonic anastamosis identified.   Retroflexed views in the     rectum revealed no abnormalities.    The scope was then withdrawn     from the patient and the procedure completed.           COMPLICATIONS:  None           ENDOSCOPIC IMPRESSION:     1) No polyps or cancers     2) Normal ileo-colo anastamosis     NO COLONIC OBSTRUCTIONAL OR ANASTOMOTIC PROBLEMS.?? adhesions     and pain vs mesenteric recurrent carcinoid.     RECOMMENDATIONS:     1) Upper endoscopy will be scheduled           REPEAT EXAM:  No           ______________________________     Vania Rea. Jarold Motto, MD, [facility]        CC:  [name], PA           n.     eSIGNED:   Vania Rea. Chau Savell at 01/31/2010 02:22 PM           [facility], [id number]  Note: An exclamation mark (!) indicates a result that was not dispersed into the flowsheet. Document Creation Date: 01/31/2010 7:27 PM _______________________________________________________________________  (1) Order result status: Final Collection or observation date-time: 01/31/2010 14:14 Requested date-time:  Receipt date-time:  Reported date-time:  Referring Physician:   Ordering Physician: Sheryn Bison [phone]) Specimen Source:  Source: Launa Grill Order Number:  [id number] Lab site:

## 2011-01-24 NOTE — Procedures (Signed)
Summary: Colon   Colonoscopy  Procedure date:  02/03/2006  Findings:      Location:  [facility].   Patient Name: [name], [name] MRN:  Procedure Procedures: Colonoscopy CPT: [id number].  Personnel: Endoscopist: Vania Rea. Jarold Motto, MD.  Exam Location: Exam performed in Outpatient Clinic. Outpatient  Patient Consent: Procedure, Alternatives, Risks and Benefits discussed, consent obtained, from patient. Consent was obtained by the RN.  Indications Symptoms: Constipation Change in bowel habits. Hx. of ilral carcinoid tumor resected in 1997.  History  Current Medications: Patient is not currently taking Coumadin.  Pre-Exam Physical: Performed Feb 03, 2006. Cardio-pulmonary exam, Rectal exam, Abdominal exam, Extremity exam, Mental status exam WNL.  Comments: Pt. history reviewed/updated, physical exam performed prior to initiation of sedation?yes Exam Exam: Extent of exam reached: Anastamosis Site, extent intended: Anastamosis Site.  Patient position: on left side. Duration of exam: 20 minutes. Colon retroflexion performed. Images taken. ASA Classification: II. Tolerance: excellent.  Monitoring: Pulse and BP monitoring, Oximetry used. Supplemental O2 given. at 2 Liters.  Colon Prep Used Golytely for colon prep. Prep results: excellent.  Sedation Meds: Patient assessed and found to be appropriate for moderate (conscious) sedation. Fentanyl 100 mcg. given IV. Versed 10 mg. given IV.  Instrument(s): CF 140L. Serial [id number].  Findings - NORMAL EXAM: Ascending Colon to Descending Colon. Not Seen: Polyps. AVM's. Colitis. Tumors. Melanosis. Crohn's. Diverticulosis.  - DIVERTICULOSIS: Descending Colon to Sigmoid Colon. Not bleeding. ICD9: Diverticulosis, Colon: 562.10. Comments: Thickened red haustal folds noted...  - PRIOR SURGERY: Cecum. Right Hemicolectomy. Biopsy/Prior Surgery taken.  Comments: Normal anastomosis noted...  - POLYP: Sigmoid Colon,  Maximum size: 5 mm. sessile polyp. Procedure:  snare with cautery, removed, not retrieved, ICD9: Colon Polyps: 211. 3.  - NORMAL EXAM: Sigmoid Colon to Rectum. Not Seen: Polyps. AVM's. Colitis. Tumors. Crohn's. Hemorrhoids.   Assessment  Diagnoses: 211.3: Colon Polyps.  562.10: Diverticulosis, Colon.   Events  Unplanned Interventions: No intervention was required.  Plans Medication Plan: Referring provider to order medications. Fiber supplements: Methylcellulose 1 tsp QAM, starting Feb 03, 2006 for indefinitely.   Patient Education: Patient given standard instructions for: Polyps. Diverticulosis. Patient instructed to get routine colonoscopy every 3 years. High fiber diet.  Disposition: After procedure patient sent to recovery. After recovery patient sent home.  Scheduling/Referral: Follow-Up prn.   cc: Isabel Caprice, MD  This report was created from the original endoscopy report, which was reviewed and signed by the above listed endoscopist.

## 2011-01-24 NOTE — Progress Notes (Signed)
Summary: appt sooner than 2-24  Phone Note Call from Patient Call back at Home Phone [phone]   Caller: Davene Costain Call For: Dr Jarold Motto Reason for Call: Talk to Nurse Summary of Call: Pt is having problems with esophagus. Feels the same like when he had the carcinoma in his throat. Would like to see Dr Jarold Motto as soon as possible. Initial call taken by: Leanor Kail [facility],  January 23, 2010 4:48 PM  Follow-up for Phone Call        Pt complains of feeling like a brick in "gut", vomiting.  Unable to eat.  Symptoms same as when was diagnosed with colon tumor.  Pt was due for colon last year.  Would like OV asap.  Appt sch to see Dr. Jarold Motto 01/25/10. Follow-up by: Ashok Cordia RN,  January 24, 2010 8:38 AM
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
## 2011-01-24 NOTE — Letter (Signed)
Summary: [facility] Instructions  [facility]  [address] [place], [place] [address]   Phone: [phone]  Fax: [phone]       ANTOINE VANDERMEULEN    July 05, 1956    MRN: [id number]        Procedure Day Dorna Bloom: Wednesday,  01/31/10     Arrival Time: 12:30      Procedure Time: 1:30     Location of Procedure:                    _X _  [facility] (4th Floor)                        PREPARATION FOR COLONOSCOPY WITH MOVIPREP      THE DAY BEFORE YOUR PROCEDURE         DATE: 01/30/10  DAY: Tuesday  1.  Drink clear liquids the entire day-NO SOLID FOOD  2.  Do not drink anything colored red or purple.  Avoid juices with pulp.  No orange juice.  3.  Drink at least 64 oz. (8 glasses) of fluid/clear liquids during the day to prevent dehydration and help the prep work efficiently.  CLEAR LIQUIDS INCLUDE: Water Jello Ice Popsicles Tea (sugar ok, no milk/cream) Powdered fruit flavored drinks Coffee (sugar ok, no milk/cream) Gatorade Juice: apple, white grape, white cranberry  Lemonade Clear bullion, consomm, broth Carbonated beverages (any kind) Strained chicken noodle soup Hard Candy                             4.  In the morning, mix first dose of MoviPrep solution:    Empty 1 Pouch A and 1 Pouch B into the disposable container    Add lukewarm drinking water to the top line of the container. Mix to dissolve    Refrigerate (mixed solution should be used within 24 hrs)  5.  Begin drinking the prep at 5:00 p.m. The MoviPrep container is divided by 4 marks.   Every 15 minutes drink the solution down to the next mark (approximately 8 oz) until the full liter is complete.   6.  Follow completed prep with 16 oz of clear liquid of your choice (Nothing red or purple).  Continue to drink clear liquids until bedtime.  7.  Before going to bed, mix second dose of MoviPrep solution:    Empty 1 Pouch A and 1 Pouch B into the disposable container    Add  lukewarm drinking water to the top line of the container. Mix to dissolve    Refrigerate  THE DAY OF YOUR PROCEDURE      DATE: 01/31/10  DAY: Wednesday  Beginning at 8:30 a.m. (5 hours before procedure):         1. Every 15 minutes, drink the solution down to the next mark (approx 8 oz) until the full liter is complete.  2. Follow completed prep with 16 oz. of clear liquid of your choice.    3. You may drink clear liquids until 11:30 (2 HOURS BEFORE PROCEDURE).   MEDICATION INSTRUCTIONS  Unless otherwise instructed, you should take regular prescription medications with a small sip of water   as early as possible the morning of your procedure.             OTHER INSTRUCTIONS  You will need a responsible adult at least 53 years of age to accompany you and  drive you home.   This person must remain in the waiting room during your procedure.  Wear loose fitting clothing that is easily removed.  Leave jewelry and other valuables at home.  However, you may wish to bring a book to read or  an iPod/MP3 player to listen to music as you wait for your procedure to start.  Remove all body piercing jewelry and leave at home.  Total time from sign-in until discharge is approximately 2-3 hours.  You should go home directly after your procedure and rest.  You can resume normal activities the  day after your procedure.  The day of your procedure you should not:   Drive   Make legal decisions   Operate machinery   Drink alcohol   Return to work  You will receive specific instructions about eating, activities and medications before you leave.    The above instructions have been reviewed and explained to me by   _______________________    I fully understand and can verbalize these instructions _____________________________ Date _________

## 2011-01-26 NOTE — Discharge Summary (Signed)
NAMEOCTAVIUS, [name]              ACCOUNT NO.:  [id number]  MEDICAL RECORD NO.:  [id number]          PATIENT TYPE:  INP  LOCATION:  1317                         FACILITY:  [facility]  PHYSICIAN:  Genene Churn. Granfortuna, M.D.DATE OF BIRTH:  1956/08/13  DATE OF ADMISSION:  01/15/2011 DATE OF DISCHARGE:  01/17/2011                              DISCHARGE SUMMARY   HISTORY:  Mr. [name] is a 55 year old man with type 2 diabetes and hypertension.  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  He was in for a routine office followup visit on the day of the current admission.  Review of systems revealed a number of new neurologic signs. He was driving to a job in [place].  He suddenly became disoriented.  He forgot who he was, where he was or why he was there.  Over the course of the next 2 weeks, he had problems with memory.  This morning when he went to write his name, he wrote the name of a coworker instead of his own.  He has had intermittent headaches over the last 24 hours.  No change in vision, no slurred speech, no focal weakness.  His blood pressure and sugars have been under good control.  In view of the acute neurologic symptoms, he was admitted for further evaluation.  Initial exam revealed a healthy-appearing Caucasian man.  His speech was fluent.  He was alert and oriented.  Blood pressure 139/86, pulse 69 and regular, temperature 98.3, respirations 20.  Weight 313 pounds, down from 319 last year.  Pupils equal, reactive to light.  Optic disks sharp, vessels normal.  No hemorrhage or exudate.  Full extraocular movements.  Tongue midline.  Palate elevates symmetrically.  Neck supple.  Carotids 2+, no bruits.  No cardiac murmurs.  Regular cardiac rhythm.  No mass,  tenderness or organomegaly in the abdomen.  Scars from previous melanoma and small bowel surgery on the abdominal wall.  No evidence for local recurrence of melanoma.  Additional neurologic findings, cranial nerves were normal.  Formal visual testing not done. Motor strength 5/5.  Reflexes 2+ and symmetric.  Coordination, finger-to- finger, finger-to-hand with minimal clumsiness on rapid alternating movements of the left hand compared with the right.  Gait normal.  Laboratory done last week in anticipation of his office visit on December 31, 2010, showed hemoglobin 14, hematocrit 42, white count 5100, platelets 191,000.  Chem profile normal except random glucose of 134 and a known diabetic.  HOSPITAL COURSE:  An MRI scan of the brain with contrast was done to rule out recurrent melanoma metastatic to brain.  He was premedicated with lorazepam due to claustrophobia.  There was still some motion artifact.  I reviewed the study with the neuroradiologist.  There was no evidence for any metastatic tumor.  No evidence to support an acute infarction.  Some superficial scalp nodules noted, felt to be likely sebaceous cysts.  An air-fluid level  seen in the left mastoid sinus.  Additional studies were obtained at that point.  A 2-D echocardiogram showed mild left ventricular hypertrophy with estimated ejection fraction of 60% to 65%.  Normal wall motion.  No regional wall motion abnormalities.  There was an abnormal relaxation pattern and increased filling pressure consistent with grade 2 diastolic dysfunction.  The left atrium was mildly dilated but no clots were seen either in the atrium or ventricle.  Carotid Doppler studies showed no decreased flow.  An electrocardiogram showed sinus bradycardia, otherwise normal.   Chest radiograph done 1 week prior to admission was normal.  A neurologic consultation was obtained.  Neurologist felt this might be a migraine variant.   EEG was ordered and  results are normal.  Laboratory studies were repeated.  Hemoglobin 13.7, MCV 87.5, white count 5900, platelets 201,000.  Potassium 4.0, random glucose 202, BUN 22, creatinine 1.1.  RPR negative.  As I recall, vitamin studies B12, folic acid were also normal but not scanned in through the computer system at this time.  The patient continued to have intermittent headaches.  No progressive neurologic signs or symptoms.  Nurses did report he was having some problems with memory.  His wife arrived.  We reviewed the results of the testing to date.  I felt he was stable enough to go home with supervision.  I advised that he stay out of work until followup visit with neurology and myself as an outpatient.  CONSULTATIONS:  Neurology.  PROCEDURES:  MRI brain, echocardiogram, carotid Doppler studies, EEG.  DISCHARGE DIAGNOSES: 1. Idiopathic subacute neurologic symptoms with headache, memory loss     and disorientation, rule out a migraine variant versus transient     ischemic attack. 2. Type 2 diabetes. 3. Essential hypertension. 4. History of resected stage I melanoma, abdominal wall. 5. 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  DISPOSITION:  Condition stable at time of discharge.  He will resume limited activity.  He is advised to stay out of work until followup with neurology in 1 to 2 weeks.  Follow up with me in 1 to 2 weeks.  Call for any interim problems.  Medications to include aspirin 81 mg daily, Actos 45 mg daily, Glucophage 2000 mg q.a.m., Lasix 20 mg daily, losartan 50/12.5 one daily, Nexium 40 mg one daily,  Levemir  pen insulin 60 units subcutaneous h.s.  Diabetic diet.     Genene Churn. Cyndie Chime, M.D.     Lottie Rater  D:  01/21/2011  T:  01/21/2011  Job:  [id number]  cc:   Joycelyn Schmid, MD Fax: [phone]  Burnell Blanks, MD Fax: [phone]  Electronically Signed by Cephas Darby M.D. on 01/26/2011 08:27:38 AM

## 2011-02-01 DIAGNOSIS — R413 Other amnesia: Secondary | ICD-10-CM

## 2011-02-01 DIAGNOSIS — F411 Generalized anxiety disorder: Secondary | ICD-10-CM

## 2011-02-07 DIAGNOSIS — R413 Other amnesia: Secondary | ICD-10-CM

## 2011-02-07 DIAGNOSIS — F411 Generalized anxiety disorder: Secondary | ICD-10-CM

## 2011-02-19 NOTE — Letter (Signed)
Summary: [facility]  [facility]   Imported By: Lennie Odor 02/15/2011 15:38:42  _____________________________________________________________________  External Attachment:    Type:   Image     Comment:   External Document

## 2011-02-25 ENCOUNTER — Other Ambulatory Visit: Payer: Self-pay | Admitting: Dermatology

## 2011-02-25 NOTE — Consult Note (Signed)
[name], [name]              ACCOUNT NO.:  [id number]  MEDICAL RECORD NO.:  [id number]          PATIENT TYPE:  INP  LOCATION:  1317                         FACILITY:  [facility]  PHYSICIAN:  Joycelyn Schmid, MD   DATE OF BIRTH:  12/09/1956  DATE OF CONSULTATION:  01/16/2011 DATE OF DISCHARGE:                                CONSULTATION   REASON FOR CONSULTATION:  Transient confusion.  HISTORY OF PRESENT ILLNESS:  This is a 55 year old male with hypertension, diabetes, carcinoid tumor of GI tract resected 1997, abdominal wall melanoma resected 1995 and newly diagnosed transient memory loss.  The patient states approximately 1 month ago he was at [facility] driving his car when he noted he had a transient decrease in knowledge where he was.  His decrease in knowledge has startled him enough to the point that he hold his car over to the side of the road. The timeframe of this decrease in memory or acknowledgement of where he was only approximately 3 minutes, after which he continued driving his car and continued on with his day.  Since that date, he has also noted periods in which he has hit the wrong buttons on his phone which is very abnormal for him.  In addition over the last few days he signed both checks and documents with the wrong name.  After further discussion with the patient, he also stated that he has noticed a chronic headache that has progressively increased over the last month.  He describes the headache as a constant tension like headache, frontal aspect of his head and does impede in his daily activities as he feels it causes him to have decrease concentration.  He denies any aura, photophobia, phonophobia, lack of ability to move any extremities, decrease in sensation, seizure activity, incontinence or tongue biting.  He states he does not have a history of headache, however, his mother is in the room who tells me she had a strong history of migraine headache as  a child.  The patient was unaware of this.  At present time, the patient is alert and oriented.  He follows all commands.  He is at his baseline with the exception of a minor bilateral frontal headache.  PAST MEDICAL HISTORY:  Hypertension, diabetes, abdominal wall melanoma resected September 1995, carcinoid tumor in GI tract resected in 1997.  ALLERGIES:  CIPRO, AVELOX, IVP DYE, BEE STINGS.  MEDICATIONS:  At the present time, the patient is on aspirin, Lasix, Lantus, Cozaar, Protonix, Actos, Tylenol, hydrocortisone, Zofran and prochlorperazine  FAMILY HISTORY:  Mother has migraine headache, hypertension.  Father died of an MI.  He also had diabetes and hypertension.  SOCIAL HISTORY:  The patient is married.  He does not drink, smoke or do illicit drugs.  He has 1 child.  REVIEW OF SYSTEMS:  Positive for headache, transient decrease memory, decrease concentration, otherwise negative.  PHYSICAL EXAMINATION:  VITAL SIGNS:  Blood pressure is 122/76, pulse 66, respirations 16, temperature 97.4. GENERAL:  The patient is alert and oriented x3, carries out 2 and 3 step commands without any difficulty. HEENT:  Pupils are equal, round and reactive to light  and accommodating, conjugate.  Extraocular movements intact.  Visual fields grossly intact. Face symmetrical.  Tongue is midline.  Uvula is midline. NEUROLOGIC:  Facial sensation V1 to V3 is full.  Shoulder shrug, head turn is within normal limits.  Coordination, finger-to-nose, heel-to- shin are smooth.  Fine motor movements are normal.  Motor is strong at 5/5 with no tremor or asterixis.  Deep tendon reflexes were depressed throughout with downgoing toes.  The patient shows no drift in the upper or lower extremities.  The patient's sensation was full to pinprick, light touch and vibration throughout.  However, he did show decrease sensation to vibration and light touch at his feet.  The patient states he does have a known peripheral  neuropathy.  LABORATORY DATA:  At present time, the patient's CMP, CBC are pending. The patient's 2-D echo is pending.  Carotid Doppler is pending.  MRI shows no intracranial abnormalities.  It is very motion degraded.  It does show multiple scalp tissue nodules often representing sebaceous cyst.  ASSESSMENT:  This is a 55 year old male with episodes of transient mentation and forgetfulness accompanied by constant headache over the past month.  He is asymptomatic at this time.  MRI with and without contrast showed negative for any intracranial abnormalities or masses. At this time, TIA is a possible diagnosis, however, other differential diagnosis includes migraine headaches.  I agree with the TIA, stroke workup.  I will discuss with Dr. Danae Orleans possibility of migraine prophylaxis and other diagnostic modalities.  This consultation, patient's medications, past medical history, previous history and physicals were thoroughly reviewed and discussed with the patient.     Felicie Morn, PA-C   ______________________________ Joycelyn Schmid, MD    DS/MEDQ  D:  01/16/2011  T:  01/16/2011  Job:  [id number]  cc:   Genene Churn. Cyndie Chime, M.D. Fax: [phone]  Joycelyn Schmid, MD Fax: [phone]  Electronically Signed by Felicie Morn PA-C on 01/18/2011 04:14:37 PM Electronically Signed by Joycelyn Schmid  on 02/25/2011 12:19:35 PM

## 2011-05-10 NOTE — Op Note (Signed)
[facility]  Patient:    [name], [name]                       MRN: [id number] Proc. Date: 01/22/01 Adm. Date:  [id number] Attending:  Shelly Rubenstein CC:         Genene Churn. Cyndie Chime, M.D.   Operative Report  PREOPERATIVE DIAGNOSIS:  Ventral incisional hernia.  POSTOPERATIVE DIAGNOSIS:  Ventral incisional hernia.  PROCEDURE:  Laparoscopic ventral hernia repair with mesh.  SURGEON:  Abigail Miyamoto, M.D.  ASSISTANT:  Sharlet Salina T. Hoxworth, M.D.  ANESTHESIA:  General endotracheal and 0.25% Marcaine plain.  ESTIMATED BLOOD LOSS:  Minimal.  DESCRIPTION OF PROCEDURE:  The patient was brought to the operating room and identified as Mercer Pod.  He was placed supine on the operating table, and general anesthesia was induced.  His abdomen was then prepped and draped in the usual sterile fashion.  Using the #15 blade, a small, vertical incision was made in the epigastrium.  Incision was carried down to the fascia with the scalpel.  The peritoneum was then entered.  The patient was found to have a moderate amount of adhesions in the upper abdomen.  The Hasson port was placed through this, but was unable to insufflate the abdomen.  Therefore, the fascial defect was closed with a 0 Vicryl pursestring suture.  Next, a separate incision was made in the patients left flank.  Incision was carried down through the muscle layers with both the cautery and the scalpel.  The peritoneum was then identified and opened.  The Hasson port was then placed through this opening after a Vicryl pursestring suture was placed around the fascia.  Insufflation of the abdomen was then begun.  Upon inserting the camera, the patient was found to have moderate adhesions of the omentum to his midline.  A 5 mm port was placed in the patients left lower quadrant under direct vision.  The adhesions were then taken down both bluntly and with the scissor electrocautery.  As  adhesions were being taken down, the right lower quadrant could be visualized, and a separate 5 mm port was placed here under direct vision.  Finally all of the adhesions could be removed, and the two small hernia defects could be identified, one in the umbilicus and one at the upper midline.  No other hernia defects were identified.  After the defects were appropriately measured, a piece of 15 x 19 Gore-Tex dualmesh was brought onto the field.  Sutures of 0 Novofil were then placed in the four quadrants of the mesh laterally.  The mesh was then rolled up and inserted through the Hasson port.  Under direct vision, the mesh was then rolled out and placed. Four separate stab incisions were then made with an 11 blade scalpel and using suture passer, the sutures were pulled through the fascia and up to the skin. The four sutures were then tied in place, securing the piece of Gore-Tex mesh to the abdominal wall.  The suture tacker was then used to tack in the mesh circumferentially, giving excellent coverage of the hernia defects. Hemostasis appeared to be achieved.  Again, the mesh appeared to lie flat on the abdominal wall.  All ports were then removed under direct vision, and the abdomen was deflated.  The 0 Vicryl at the lateral port site was then tied in place.  All incisions were then anesthetized with 0.25% Marcaine and closed with 4-0 Monocryl sutures.  Steri-Strips, gauze, and  tape were then applied. The patient tolerated the procedure well.  All sponge, needle and instrument counts were correct at the end of the procedure.  The patient was then extubated in the operating room and taken in stable condition to the recovery room. DD:  01/22/01 TD:  01/22/01 Job: [id number]

## 2011-07-01 ENCOUNTER — Encounter (HOSPITAL_BASED_OUTPATIENT_CLINIC_OR_DEPARTMENT_OTHER): Payer: BC Managed Care – PPO | Admitting: Oncology

## 2011-07-01 ENCOUNTER — Other Ambulatory Visit: Payer: Self-pay | Admitting: Oncology

## 2011-07-01 DIAGNOSIS — Z8509 Personal history of malignant neoplasm of other digestive organs: Secondary | ICD-10-CM

## 2011-07-01 DIAGNOSIS — Z85820 Personal history of malignant melanoma of skin: Secondary | ICD-10-CM

## 2011-07-01 LAB — COMPREHENSIVE METABOLIC PANEL
Alkaline Phosphatase: 55 U/L (ref 39–117)
BUN: 16 mg/dL (ref 6–23)
Creatinine, Ser: 1.15 mg/dL (ref 0.50–1.35)
Glucose, Bld: 257 mg/dL — ABNORMAL HIGH (ref 70–99)
Sodium: 139 mEq/L (ref 135–145)
Total Bilirubin: 0.5 mg/dL (ref 0.3–1.2)

## 2011-07-01 LAB — CBC WITH DIFFERENTIAL/PLATELET
Basophils Absolute: 0.1 10*3/uL (ref 0.0–0.1)
Eosinophils Absolute: 0.1 10*3/uL (ref 0.0–0.5)
LYMPH%: 22.4 % (ref 14.0–49.0)
MCV: 88.3 fL (ref 79.3–98.0)
MONO%: 9.1 % (ref 0.0–14.0)
NEUT#: 5 10*3/uL (ref 1.5–6.5)
Platelets: 187 10*3/uL (ref 140–400)
RBC: 4.75 10*6/uL (ref 4.20–5.82)

## 2011-07-16 ENCOUNTER — Encounter (HOSPITAL_BASED_OUTPATIENT_CLINIC_OR_DEPARTMENT_OTHER): Payer: BC Managed Care – PPO | Admitting: Oncology

## 2011-07-16 DIAGNOSIS — Z8509 Personal history of malignant neoplasm of other digestive organs: Secondary | ICD-10-CM

## 2011-07-16 DIAGNOSIS — Z85820 Personal history of malignant melanoma of skin: Secondary | ICD-10-CM

## 2011-08-23 ENCOUNTER — Other Ambulatory Visit: Payer: Self-pay | Admitting: Gastroenterology

## 2011-11-25 ENCOUNTER — Other Ambulatory Visit: Payer: Self-pay | Admitting: Oncology

## 2011-11-25 DIAGNOSIS — G43909 Migraine, unspecified, not intractable, without status migrainosus: Secondary | ICD-10-CM

## 2011-12-30 ENCOUNTER — Telehealth: Payer: Self-pay | Admitting: Oncology

## 2011-12-30 NOTE — Telephone Encounter (Signed)
lmonvm advising the pt of his chest x-ray appt that is due prior to the appts on the same day

## 2012-01-21 ENCOUNTER — Ambulatory Visit: Payer: BC Managed Care – PPO | Admitting: Nurse Practitioner

## 2012-01-21 ENCOUNTER — Other Ambulatory Visit (HOSPITAL_BASED_OUTPATIENT_CLINIC_OR_DEPARTMENT_OTHER): Payer: BC Managed Care – PPO | Admitting: Lab

## 2012-01-21 ENCOUNTER — Other Ambulatory Visit: Payer: Self-pay | Admitting: Oncology

## 2012-01-21 ENCOUNTER — Ambulatory Visit (HOSPITAL_COMMUNITY)
Admission: RE | Admit: 2012-01-21 | Discharge: 2012-01-21 | Disposition: A | Discharge status: Home / Self Care | Payer: BC Managed Care – PPO | Source: Ambulatory Visit | Attending: Oncology | Admitting: Oncology

## 2012-01-21 DIAGNOSIS — R05 Cough: Secondary | ICD-10-CM | POA: Insufficient documentation

## 2012-01-21 DIAGNOSIS — R059 Cough, unspecified: Secondary | ICD-10-CM | POA: Insufficient documentation

## 2012-01-21 DIAGNOSIS — Z85820 Personal history of malignant melanoma of skin: Secondary | ICD-10-CM

## 2012-01-21 DIAGNOSIS — Z8509 Personal history of malignant neoplasm of other digestive organs: Secondary | ICD-10-CM

## 2012-01-21 DIAGNOSIS — C801 Malignant (primary) neoplasm, unspecified: Secondary | ICD-10-CM

## 2012-01-21 LAB — COMPREHENSIVE METABOLIC PANEL
CO2: 22 mEq/L (ref 19–32)
Creatinine, Ser: 1.07 mg/dL (ref 0.50–1.35)
Glucose, Bld: 188 mg/dL — ABNORMAL HIGH (ref 70–99)
Sodium: 139 mEq/L (ref 135–145)
Total Bilirubin: 0.4 mg/dL (ref 0.3–1.2)
Total Protein: 6.4 g/dL (ref 6.0–8.3)

## 2012-01-21 LAB — CBC WITH DIFFERENTIAL/PLATELET
Basophils Absolute: 0 10*3/uL (ref 0.0–0.1)
Eosinophils Absolute: 0.1 10*3/uL (ref 0.0–0.5)
HCT: 41.3 % (ref 38.4–49.9)
HGB: 14.1 g/dL (ref 13.0–17.1)
LYMPH%: 18.6 % (ref 14.0–49.0)
MONO#: 0.7 10*3/uL (ref 0.1–0.9)
NEUT#: 6.3 10*3/uL (ref 1.5–6.5)
NEUT%: 71.2 % (ref 39.0–75.0)
Platelets: 193 10*3/uL (ref 140–400)
WBC: 8.9 10*3/uL (ref 4.0–10.3)

## 2012-01-28 ENCOUNTER — Telehealth: Payer: Self-pay | Admitting: Oncology

## 2012-01-28 ENCOUNTER — Ambulatory Visit (HOSPITAL_BASED_OUTPATIENT_CLINIC_OR_DEPARTMENT_OTHER): Payer: BC Managed Care – PPO | Admitting: Nurse Practitioner

## 2012-01-28 VITALS — BP 151/88 | HR 72 | Temp 98.4°F | Ht 76.0 in | Wt 319.2 lb

## 2012-01-28 DIAGNOSIS — R29818 Other symptoms and signs involving the nervous system: Secondary | ICD-10-CM

## 2012-01-28 DIAGNOSIS — E119 Type 2 diabetes mellitus without complications: Secondary | ICD-10-CM

## 2012-01-28 DIAGNOSIS — I10 Essential (primary) hypertension: Secondary | ICD-10-CM

## 2012-01-28 DIAGNOSIS — C4359 Malignant melanoma of other part of trunk: Secondary | ICD-10-CM

## 2012-01-28 DIAGNOSIS — Z85820 Personal history of malignant melanoma of skin: Secondary | ICD-10-CM

## 2012-01-28 NOTE — Progress Notes (Signed)
OFFICE PROGRESS NOTE  Interval history:  [name] is a 56 year old man with a remote history of a [name], [name].02 [name] melanoma excised from the lower abdominal wall in [name].  He was subsequently diagnosed with a [name], one node carcinoid tumor of the terminal ilium, resected December 1997.  He is seen today for scheduled followup.  [name] reports that he overall feels well. He has a good appetite and good energy level. The headache he has been experiencing are better. He is currently on Topamax 50 mg twice daily. He is no longer having problems with memory and absence spells. No interim illnesses or infections. He denies pain. No shortness of breath or cough. Bowels moving regularly. No hematochezia or melena. He reports undergoing a routine colonoscopy [name] year ago. No hematuria or dysuria. He follows up with his dermatologist on a regular basis.   Objective: Blood pressure 151/88, pulse 72, temperature 98.4 F (36.9 C), temperature source Oral, height 6\' 4"  ([name]), weight 319 lb 3.2 oz (144.788 kg).  Oropharynx is without thrush or ulceration. No palpable cervical, supraclavicular, axillary or inguinal lymph nodes. Lungs are clear. No wheezes or rales. Regular cardiac rhythm. Abdomen is soft and nontender. No organomegaly. [name] lower leg edema bilaterally.  Lab Results: Lab Results  Component Value Date   WBC 8.9 [name]   HGB 14.[name] [name]   HCT 41.3 [name]   MCV 89.4 [name]   PLT 193 [name]    Chemistry:    Chemistry      Component Value Date/Time   NA 139 [name] 1122   K 4.0 [name] 1122   CL 107 [name] 1122   CO2 22 [name] 1122   BUN 31* [name] 1122   CREATININE [name].07 [name] 1122   CREATININE [name].21 06/26/2006 0912      Component Value Date/Time   CALCIUM 9.0 [name] 1122   ALKPHOS 51 [name] 1122   AST 20 [name] 1122   ALT 37 [name] 1122   BILITOT 0.4 [name] 1122       Studies/Results: Dg Chest 2 View  [name]   *RADIOLOGY REPORT*  Clinical Data: History of melanoma and coughing.  CHEST - 2 VIEW  Comparison: 12/31/2010. 12/26/2009.  Findings: There is slight enlargement of the cardiac silhouette. Mediastinal and hilar contours appear stable.  Slight prominence of the inferior hilar region is stable bilaterally and is felt to be vascular.  No pulmonary infiltrates or nodules were evident. No pleural abnormality is evident. Bones appear average for age.  IMPRESSION: No acute or active cardiopulmonary abnormalities are seen.  No pneumonia or metastatic lesions are evident.  Original Report Authenticated By: Crawford Givens, M.D.    Medications: [name] have reviewed the patient's current medications.  Assessment/Plan:  [name]. [name], [name], [name]. 2. [name] [name] node positive carcinoid tumor, terminal ileum resected December, 1997.  Last colonoscopy approximately one year ago.  3. Atypical neurological symptoms and possibly migraine variant.  Symptoms improved since beginning Topamax.   4. Essential hypertension. 5. Type 2 diabetes currently on metformin, Lantus insulin and NovoLog insulin.  Disposition-[name] appears stable. He will return for a followup visit in 6 months. He will contact the office the interim with any problems.  [name] ANP/GNP-BC

## 2012-01-28 NOTE — Telephone Encounter (Signed)
appts made and printed for 7/29 and 8/5   aom

## 2012-03-05 ENCOUNTER — Other Ambulatory Visit: Payer: Self-pay | Admitting: Specialist

## 2012-03-05 DIAGNOSIS — M25511 Pain in right shoulder: Secondary | ICD-10-CM

## 2012-03-12 ENCOUNTER — Ambulatory Visit
Admission: RE | Admit: 2012-03-12 | Discharge: 2012-03-12 | Disposition: A | Discharge status: Home / Self Care | Payer: BC Managed Care – PPO | Source: Ambulatory Visit | Attending: Specialist | Admitting: Specialist

## 2012-03-12 DIAGNOSIS — M25511 Pain in right shoulder: Secondary | ICD-10-CM

## 2012-05-08 ENCOUNTER — Other Ambulatory Visit: Payer: Self-pay | Admitting: Gastroenterology

## 2012-05-20 ENCOUNTER — Other Ambulatory Visit: Payer: Self-pay | Admitting: Dermatology

## 2012-07-20 ENCOUNTER — Other Ambulatory Visit: Payer: BC Managed Care – PPO

## 2012-07-20 ENCOUNTER — Ambulatory Visit (HOSPITAL_BASED_OUTPATIENT_CLINIC_OR_DEPARTMENT_OTHER): Payer: BC Managed Care – PPO

## 2012-07-20 DIAGNOSIS — C4359 Malignant melanoma of other part of trunk: Secondary | ICD-10-CM

## 2012-07-20 LAB — LACTATE DEHYDROGENASE: LDH: 145 U/L (ref 94–250)

## 2012-07-20 LAB — COMPREHENSIVE METABOLIC PANEL
ALT: 36 U/L (ref 0–53)
Albumin: 4.3 g/dL (ref 3.5–5.2)
CO2: 23 mEq/L (ref 19–32)
Calcium: 9.3 mg/dL (ref 8.4–10.5)
Chloride: 104 mEq/L (ref 96–112)
Glucose, Bld: 235 mg/dL — ABNORMAL HIGH (ref 70–99)
Potassium: 3.8 mEq/L (ref 3.5–5.3)
Sodium: 139 mEq/L (ref 135–145)
Total Bilirubin: 0.5 mg/dL (ref 0.3–1.2)
Total Protein: 6.8 g/dL (ref 6.0–8.3)

## 2012-07-20 LAB — CBC WITH DIFFERENTIAL/PLATELET
Basophils Absolute: 0.1 10*3/uL (ref 0.0–0.1)
Eosinophils Absolute: 0.1 10*3/uL (ref 0.0–0.5)
HCT: 42.6 % (ref 38.4–49.9)
LYMPH%: 21.4 % (ref 14.0–49.0)
MCHC: 33.8 g/dL (ref 32.0–36.0)
MCV: 88.6 fL (ref 79.3–98.0)
MONO#: 0.6 10*3/uL (ref 0.1–0.9)
MONO%: 8.4 % (ref 0.0–14.0)
NEUT#: 5.2 10*3/uL (ref 1.5–6.5)
NEUT%: 67.8 % (ref 39.0–75.0)
Platelets: 195 10*3/uL (ref 140–400)
RBC: 4.81 10*6/uL (ref 4.20–5.82)
WBC: 7.7 10*3/uL (ref 4.0–10.3)

## 2012-07-27 ENCOUNTER — Ambulatory Visit (HOSPITAL_BASED_OUTPATIENT_CLINIC_OR_DEPARTMENT_OTHER): Payer: BC Managed Care – PPO | Admitting: Oncology

## 2012-07-27 ENCOUNTER — Telehealth: Payer: Self-pay | Admitting: Oncology

## 2012-07-27 ENCOUNTER — Encounter: Payer: Self-pay | Admitting: Oncology

## 2012-07-27 VITALS — BP 138/94 | HR 87 | Temp 97.9°F | Resp 20 | Ht 76.0 in | Wt 308.9 lb

## 2012-07-27 DIAGNOSIS — E119 Type 2 diabetes mellitus without complications: Secondary | ICD-10-CM

## 2012-07-27 DIAGNOSIS — C4359 Malignant melanoma of other part of trunk: Secondary | ICD-10-CM

## 2012-07-27 DIAGNOSIS — I10 Essential (primary) hypertension: Secondary | ICD-10-CM

## 2012-07-27 DIAGNOSIS — R29818 Other symptoms and signs involving the nervous system: Secondary | ICD-10-CM

## 2012-07-27 DIAGNOSIS — D3A019 Benign carcinoid tumor of the small intestine, unspecified portion: Secondary | ICD-10-CM

## 2012-07-27 DIAGNOSIS — C7A012 Malignant carcinoid tumor of the ileum: Secondary | ICD-10-CM

## 2012-07-27 HISTORY — DX: Benign carcinoid tumor of the small intestine, unspecified portion: D3A.019

## 2012-07-27 HISTORY — DX: Essential (primary) hypertension: I10

## 2012-07-27 NOTE — Telephone Encounter (Signed)
appts made and printed for pt aom °

## 2012-07-27 NOTE — Progress Notes (Signed)
Hematology and Oncology Follow Up Visit  [name] [id number] 10-20-1956 56 y.o. 07/27/2012 5:58 PM   Principle Diagnosis: Encounter Diagnoses  Name Primary?  . Carcinoid tumor of small intestine   . Malignant Melanoma Of Skin Of Abdomen Yes  . MALIGNANT MELANOMA SKIN TRUNK EXCEPT SCROTUM   . HTN (hypertension), benign      Interim History:   Followup visit for this 56 year old man initially diagnosed with a Clark's level I, Breslow depth 1.02 mm stage I malignant melanoma excised from the lower abdominal wall in August of 1995. He was diagnosed with a second primary stage III, one lymph node positive, carcinoid tumor of the terminal ileum, resected in December 1997.  Overall he is doing well. He has had no signs of recurrence of any of his previous malignant conditions. Headaches and memory problems that he was having in the recent past have improved on increased dose of Topamax.  Since last visit, he had surgery on his right shoulder in May of this year by Dr. August Saucer. The healing process has been slow. He is still having significant discomfort. Apparently some abnormality was seen on a ultrasound of his right neck suggesting a mass. However a CT scan of the neck done in June was reported to him as normal. Most recent chest radiograph done 01/21/2012 was normal.  He denies any abdominal pain, change in bowel habits, hematochezia or melena. He has not noted any new skin lesions.  Medications: reviewed  Allergies:  Allergies  Allergen Reactions  . Ciprofloxacin   . Iohexol      Code: HIVES, Desc: Do not give IV contrast; pt premedicated with radiologists prep; hives occurred; pt had prev reaction w/ md prep; prev reaction hives; dyspnea; throat swelling, Onset Date: [id number]   . Moxifloxacin     Review of Systems: Constitutional:   No constitutional symptoms Respiratory: No cough or dyspnea Cardiovascular:  No chest pain or palpitations Gastrointestinal: No abdominal  pain Genito-Urinary: No urinary tract symptoms Musculoskeletal: No muscle or bone pain Neurologic: Occasional migraine headache Skin: No skin lesion, rash, or ecchymosis Remaining ROS negative.  Physical Exam: Blood pressure 138/94, pulse 87, temperature 97.9 F (36.6 C), resp. rate 20, height 6\' 4"  (1.93 m), weight 308 lb 14.4 oz (140.116 kg). Wt Readings from Last 3 Encounters:  07/27/12 308 lb 14.4 oz (140.116 kg)  01/28/12 319 lb 3.2 oz (144.788 kg)  01/30/10 313 lb (141.976 kg)     General appearance: Tall overweight Caucasian man [name]: Pharynx no erythema or exudate Lymph nodes: No cervical, supraclavicular, or axillary adenopathy. Breasts: Lungs: Clear to auscultation resonant to percussion Heart: Regular rhythm no murmur Abdomen: Soft obese nontender, no mass, no organomegaly, no recurrent skin lesions in the suprapubic area site of excision of previous melanoma. Extremities: No edema no calf tenderness Vascular: No cyanosis Neurologic: Motor strength 5 over 5 Skin: No rash or ecchymosis  Lab Results: Lab Results  Component Value Date   WBC 7.7 07/20/2012   HGB 14.4 07/20/2012   HCT 42.6 07/20/2012   MCV 88.6 07/20/2012   PLT 195 07/20/2012     Chemistry      Component Value Date/Time   NA 139 07/20/2012 1138   K 3.8 07/20/2012 1138   CL 104 07/20/2012 1138   CO2 23 07/20/2012 1138   BUN 25* 07/20/2012 1138   CREATININE 1.33 07/20/2012 1138   CREATININE 1.21 06/26/2006 0912      Component Value Date/Time   CALCIUM 9.3 07/20/2012 1138  ALKPHOS 56 07/20/2012 1138   AST 16 07/20/2012 1138   ALT 36 07/20/2012 1138   BILITOT 0.5 07/20/2012 1138       Radiological Studies: See discussion above  Impression and Plan:  1. Clark's level 1, Breslow depth 1.02 mm, stage I malignant melanoma excised from the lower abdominal wall August 1995. 2. 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 Last colonoscopy approximately one year ago.   3. Atypical neurological symptoms and possibly migraine variant. Symptoms improved since beginning Topamax.  4. Essential hypertension. 5. Type 2 diabetes currently on metformin, Lantus insulin and NovoLog insulin.  CC:. [name] PA/Mira Hamrick M.D.; Dr. Venancio Poisson; Dr. Sheryn Bison   Levert Feinstein, MD 8/5/20135:58 PM

## 2012-11-27 ENCOUNTER — Other Ambulatory Visit: Payer: Self-pay | Admitting: Dermatology

## 2013-01-12 ENCOUNTER — Other Ambulatory Visit: Payer: Self-pay | Admitting: Dermatology

## 2013-06-11 ENCOUNTER — Other Ambulatory Visit: Payer: Self-pay | Admitting: Dermatology

## 2013-07-07 ENCOUNTER — Other Ambulatory Visit: Payer: Self-pay | Admitting: Dermatology

## 2013-07-26 ENCOUNTER — Ambulatory Visit (HOSPITAL_COMMUNITY)
Admission: RE | Admit: 2013-07-26 | Discharge: 2013-07-26 | Disposition: A | Discharge status: Home / Self Care | Payer: BC Managed Care – PPO | Source: Ambulatory Visit | Attending: Oncology | Admitting: Oncology

## 2013-07-26 ENCOUNTER — Other Ambulatory Visit (HOSPITAL_BASED_OUTPATIENT_CLINIC_OR_DEPARTMENT_OTHER): Payer: BC Managed Care – PPO

## 2013-07-26 DIAGNOSIS — C4359 Malignant melanoma of other part of trunk: Secondary | ICD-10-CM

## 2013-07-26 DIAGNOSIS — D490 Neoplasm of unspecified behavior of digestive system: Secondary | ICD-10-CM | POA: Insufficient documentation

## 2013-07-26 DIAGNOSIS — D3A019 Benign carcinoid tumor of the small intestine, unspecified portion: Secondary | ICD-10-CM

## 2013-07-26 DIAGNOSIS — Z85820 Personal history of malignant melanoma of skin: Secondary | ICD-10-CM | POA: Insufficient documentation

## 2013-07-26 LAB — COMPREHENSIVE METABOLIC PANEL (CC13)
ALT: 46 U/L (ref 0–55)
BUN: 22.7 mg/dL (ref 7.0–26.0)
CO2: 27 mEq/L (ref 22–29)
Creatinine: 1.2 mg/dL (ref 0.7–1.3)
Total Bilirubin: 0.46 mg/dL (ref 0.20–1.20)

## 2013-07-26 LAB — CBC WITH DIFFERENTIAL/PLATELET
BASO%: 0.7 % (ref 0.0–2.0)
EOS%: 2.9 % (ref 0.0–7.0)
HCT: 43.7 % (ref 38.4–49.9)
MCH: 29.9 pg (ref 27.2–33.4)
MCHC: 33.9 g/dL (ref 32.0–36.0)
NEUT%: 64.8 % (ref 39.0–75.0)
RBC: 4.95 10*6/uL (ref 4.20–5.82)
WBC: 8.4 10*3/uL (ref 4.0–10.3)
lymph#: 1.8 10*3/uL (ref 0.9–3.3)

## 2013-07-26 LAB — LACTATE DEHYDROGENASE (CC13): LDH: 177 U/L (ref 125–245)

## 2013-07-27 ENCOUNTER — Telehealth: Payer: Self-pay | Admitting: Oncology

## 2013-07-27 NOTE — Telephone Encounter (Signed)
pt son called and needed to r/s est appt...gave him first available in NOV. pt ok and aware

## 2013-08-02 ENCOUNTER — Ambulatory Visit: Payer: BC Managed Care – PPO | Admitting: Oncology

## 2013-08-10 ENCOUNTER — Other Ambulatory Visit: Payer: Self-pay | Admitting: Dermatology

## 2013-10-11 ENCOUNTER — Telehealth: Payer: Self-pay | Admitting: Oncology

## 2013-10-11 NOTE — Telephone Encounter (Signed)
Called pt and left message regarding appt being moved from 11/3 to 11/11

## 2013-10-25 ENCOUNTER — Ambulatory Visit: Payer: BC Managed Care – PPO | Admitting: Oncology

## 2013-11-02 ENCOUNTER — Encounter (INDEPENDENT_AMBULATORY_CARE_PROVIDER_SITE_OTHER): Payer: Self-pay

## 2013-11-02 ENCOUNTER — Ambulatory Visit (HOSPITAL_BASED_OUTPATIENT_CLINIC_OR_DEPARTMENT_OTHER): Payer: BC Managed Care – PPO | Admitting: Oncology

## 2013-11-02 ENCOUNTER — Telehealth: Payer: Self-pay | Admitting: Oncology

## 2013-11-02 VITALS — BP 128/82 | HR 72 | Temp 97.7°F | Resp 18 | Ht 76.0 in | Wt 304.1 lb

## 2013-11-02 DIAGNOSIS — Z8509 Personal history of malignant neoplasm of other digestive organs: Secondary | ICD-10-CM

## 2013-11-02 DIAGNOSIS — C7A029 Malignant carcinoid tumor of the large intestine, unspecified portion: Secondary | ICD-10-CM

## 2013-11-02 DIAGNOSIS — C4359 Malignant melanoma of other part of trunk: Secondary | ICD-10-CM

## 2013-11-02 DIAGNOSIS — I10 Essential (primary) hypertension: Secondary | ICD-10-CM

## 2013-11-02 DIAGNOSIS — D3A019 Benign carcinoid tumor of the small intestine, unspecified portion: Secondary | ICD-10-CM

## 2013-11-02 DIAGNOSIS — Z85820 Personal history of malignant melanoma of skin: Secondary | ICD-10-CM

## 2013-11-02 DIAGNOSIS — E119 Type 2 diabetes mellitus without complications: Secondary | ICD-10-CM

## 2013-11-02 NOTE — Telephone Encounter (Signed)
Gave pt appt for lab and ML next year 2015

## 2013-11-05 NOTE — Progress Notes (Signed)
Hematology and Oncology Follow Up Visit  [name] [id number] 04-09-1956 57 y.o. 11/05/2013 2:10 PM   Principle Diagnosis: Encounter Diagnoses  Name Primary?  . NEOPLASM, MALIGNANT, COLORECTAL, CARCINOID Yes  . MALIGNANT MELANOMA SKIN TRUNK EXCEPT SCROTUM   . Carcinoid tumor of small intestine      Interim History:  Followup visit for this 57 year old man initially diagnosed with a Clark's level I, Breslow depth 1.02 mm stage I malignant melanoma excised from the lower abdominal wall in August of 1995.  He was diagnosed with a second primary stage III, one lymph node positive, carcinoid tumor of the terminal ileum, resected in December 1997. Most recent colonoscopy done 3 years ago in February 2011 and he was told he could wait 5 years for the next one which won't be until 2016. Since his last visit here, he has had skin lesions removed from his right upper back, right lower back, and right arm on 12/04/2012. These are all dysplastic nevi but the one from the upper back had severe atypia. There was a positive surgical margin. He required reexcision on 01/12/2013, June 20, and July 16 before  negative margins were obtained.  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  His diabetes has been under good control.    Medications: reviewed  Allergies:  Allergies  Allergen Reactions  . Ciprofloxacin   . Iohexol      Code: HIVES, Desc: Do not give IV contrast; pt premedicated with radiologists prep; hives occurred; pt had prev reaction w/ md prep; prev reaction hives; dyspnea; throat swelling, Onset Date: [id number]   . Moxifloxacin     Review of Systems: ENT ROS: No sore throat  Respiratory ROS: No cough or dyspnea Cardiovascular ROS:  No chest pain or palpitations Gastrointestinal ROS:  No abdominal pain no cramps, no change in bowel habit, no  hematochezia or melena Genito-Urinary ROS: No urinary tract symptoms Musculoskeletal ROS: See above. Neurological ROS: Occasional headache when she is relating to stress Dermatological ROS: See above Remaining ROS negative.  Physical Exam: Blood pressure 128/82, pulse 72, temperature 97.7 F (36.5 C), temperature source Oral, resp. rate 18, height 6\' 4"  (1.93 m), weight 304 lb 1.6 oz (137.939 kg). Wt Readings from Last 3 Encounters:  11/02/13 304 lb 1.6 oz (137.939 kg)  07/27/12 308 lb 14.4 oz (140.116 kg)  01/28/12 319 lb 3.2 oz (144.788 kg)     General appearance: Well-nourished Caucasian man [name]: Pharynx no erythema, exudate, mass, or ulcer. No thyromegaly or thyroid nodules Lymph nodes: No cervical, supraclavicular, or axillary lymphadenopathy Lungs: Clear to auscultation, resonant to percussion throughout Heart: Regular rhythm, no murmur, no gallop, no rub, no click, no edema Abdomen: Soft, nontender, normal bowel sounds, no mass, no organomegaly. Scar along the underwear line from previous resection of melanoma. No local recurrence obvious. Extremities: No edema, no calf tenderness Musculoskeletal: no joint deformities GU: Vascular: Carotid pulses 2+, no bruits,  Neurologic: Alert, oriented, PERRLA,  cranial nerves grossly normal, motor strength 5 over 5, reflexes 1+ symmetric, upper body coordination normal, gait normal, Skin: Innumerable moles on his back and trunk.. Large scar right upper back from excision of dysplastic nevi. Small scar lower back from excision of nevi.  Lab Results: CBC W/Diff    Component Value Date/Time   WBC 8.4 07/26/2013 1112   WBC 5.9 01/16/2011 1250  RBC 4.95 07/26/2013 1112   RBC 4.64 01/16/2011 1250   HGB 14.8 07/26/2013 1112   HGB 13.7 01/16/2011 1250   HCT 43.7 07/26/2013 1112   HCT 40.6 01/16/2011 1250   PLT 209 07/26/2013 1112   PLT 201 01/16/2011 1250   MCV 88.2 07/26/2013 1112   MCV 87.5 01/16/2011 1250   MCH 29.9 07/26/2013 1112   MCH 29.5  01/16/2011 1250   MCHC 33.9 07/26/2013 1112   MCHC 33.7 01/16/2011 1250   RDW 13.7 07/26/2013 1112   RDW 13.4 01/16/2011 1250   LYMPHSABS 1.8 07/26/2013 1112   LYMPHSABS 1.3 01/25/2010 1137   MONOABS 0.8 07/26/2013 1112   MONOABS 0.5 01/25/2010 1137   EOSABS 0.2 07/26/2013 1112   EOSABS 0.1 01/25/2010 1137   BASOSABS 0.1 07/26/2013 1112   BASOSABS 0.0 01/25/2010 1137     Chemistry      Component Value Date/Time   NA 142 07/26/2013 1112   NA 139 07/20/2012 1138   K 3.9 07/26/2013 1112   K 3.8 07/20/2012 1138   CL 104 07/20/2012 1138   CO2 27 07/26/2013 1112   CO2 23 07/20/2012 1138   BUN 22.7 07/26/2013 1112   BUN 25* 07/20/2012 1138   CREATININE 1.2 07/26/2013 1112   CREATININE 1.33 07/20/2012 1138   CREATININE 1.21 06/26/2006 0912      Component Value Date/Time   CALCIUM 9.6 07/26/2013 1112   CALCIUM 9.3 07/20/2012 1138   ALKPHOS 59 07/26/2013 1112   ALKPHOS 56 07/20/2012 1138   AST 22 07/26/2013 1112   AST 16 07/20/2012 1138   ALT 46 07/26/2013 1112   ALT 36 07/20/2012 1138   BILITOT 0.46 07/26/2013 1112   BILITOT 0.5 07/20/2012 1138       Radiological Studies: Chest x-ray 07/26/2013 normal .  Impression:  1. Clark's level 1, Breslow depth 1.02 mm, stage I malignant melanoma excised from the lower abdominal wall August 1995. Continue annual chest x-ray in addition to clinical exams and lab work. 2. 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 Last colonoscopy February 2011. Next do February 2016.  4. Atypical neurological symptoms and possibly migraine variant. Symptoms improved since beginning Topamax.  5. Essential hypertension. Now with just a rare headache. 6. Type 2 diabetes currently on metformin, Lantus insulin and NovoLog insulin. 7.  Hyperlipidemia 8. GERD 9. Orthopedic problems left shoulder   He will followup here in 6 months with nurse practitioner Lonna Cobb. Attending will be transitioned  to Dr. Truett Perna.      CC: Patient Care Team: [name] as PCP - General (Physician Assistant)   Levert Feinstein, MD 11/14/20142:10 PM

## 2013-12-22 ENCOUNTER — Other Ambulatory Visit: Payer: Self-pay | Admitting: Dermatology

## 2014-01-10 ENCOUNTER — Other Ambulatory Visit: Payer: Self-pay | Admitting: Dermatology

## 2014-04-26 ENCOUNTER — Ambulatory Visit (HOSPITAL_COMMUNITY)
Admission: RE | Admit: 2014-04-26 | Discharge: 2014-04-26 | Disposition: A | Discharge status: Home / Self Care | Payer: BC Managed Care – PPO | Source: Ambulatory Visit | Attending: Oncology | Admitting: Oncology

## 2014-04-26 ENCOUNTER — Other Ambulatory Visit (HOSPITAL_BASED_OUTPATIENT_CLINIC_OR_DEPARTMENT_OTHER): Payer: BC Managed Care – PPO

## 2014-04-26 DIAGNOSIS — Z8589 Personal history of malignant neoplasm of other organs and systems: Secondary | ICD-10-CM

## 2014-04-26 DIAGNOSIS — Z85820 Personal history of malignant melanoma of skin: Secondary | ICD-10-CM

## 2014-04-26 DIAGNOSIS — C4359 Malignant melanoma of other part of trunk: Secondary | ICD-10-CM

## 2014-04-26 DIAGNOSIS — C7A029 Malignant carcinoid tumor of the large intestine, unspecified portion: Secondary | ICD-10-CM

## 2014-04-26 DIAGNOSIS — D3A019 Benign carcinoid tumor of the small intestine, unspecified portion: Secondary | ICD-10-CM

## 2014-04-26 DIAGNOSIS — Z859 Personal history of malignant neoplasm, unspecified: Secondary | ICD-10-CM | POA: Insufficient documentation

## 2014-04-26 DIAGNOSIS — E119 Type 2 diabetes mellitus without complications: Secondary | ICD-10-CM

## 2014-04-26 LAB — CBC WITH DIFFERENTIAL/PLATELET
BASO%: 0.6 % (ref 0.0–2.0)
Basophils Absolute: 0.1 10*3/uL (ref 0.0–0.1)
EOS%: 2.5 % (ref 0.0–7.0)
Eosinophils Absolute: 0.2 10*3/uL (ref 0.0–0.5)
HCT: 42.4 % (ref 38.4–49.9)
HGB: 14.1 g/dL (ref 13.0–17.1)
LYMPH%: 20.8 % (ref 14.0–49.0)
MCH: 29.6 pg (ref 27.2–33.4)
MCHC: 33.3 g/dL (ref 32.0–36.0)
MCV: 88.6 fL (ref 79.3–98.0)
MONO#: 0.8 10*3/uL (ref 0.1–0.9)
MONO%: 9.4 % (ref 0.0–14.0)
NEUT#: 5.8 10*3/uL (ref 1.5–6.5)
NEUT%: 66.7 % (ref 39.0–75.0)
Platelets: 205 10*3/uL (ref 140–400)
RBC: 4.78 10*6/uL (ref 4.20–5.82)
RDW: 13.3 % (ref 11.0–14.6)
WBC: 8.8 10*3/uL (ref 4.0–10.3)
lymph#: 1.8 10*3/uL (ref 0.9–3.3)

## 2014-04-26 LAB — COMPREHENSIVE METABOLIC PANEL (CC13)
ALT: 31 U/L (ref 0–55)
ANION GAP: 9 meq/L (ref 3–11)
AST: 17 U/L (ref 5–34)
Albumin: 3.7 g/dL (ref 3.5–5.0)
Alkaline Phosphatase: 57 U/L (ref 40–150)
BUN: 21.1 mg/dL (ref 7.0–26.0)
CO2: 27 meq/L (ref 22–29)
Calcium: 9.5 mg/dL (ref 8.4–10.4)
Chloride: 106 mEq/L (ref 98–109)
Creatinine: 1.4 mg/dL — ABNORMAL HIGH (ref 0.7–1.3)
Glucose: 195 mg/dl — ABNORMAL HIGH (ref 70–140)
Potassium: 3.6 mEq/L (ref 3.5–5.1)
SODIUM: 142 meq/L (ref 136–145)
TOTAL PROTEIN: 6.6 g/dL (ref 6.4–8.3)
Total Bilirubin: 0.52 mg/dL (ref 0.20–1.20)

## 2014-04-26 LAB — LACTATE DEHYDROGENASE (CC13): LDH: 182 U/L (ref 125–245)

## 2014-05-03 ENCOUNTER — Ambulatory Visit (HOSPITAL_BASED_OUTPATIENT_CLINIC_OR_DEPARTMENT_OTHER): Payer: BC Managed Care – PPO | Admitting: Nurse Practitioner

## 2014-05-03 VITALS — BP 133/82 | HR 85 | Temp 98.4°F | Resp 18 | Ht 76.0 in | Wt 308.7 lb

## 2014-05-03 DIAGNOSIS — E119 Type 2 diabetes mellitus without complications: Secondary | ICD-10-CM

## 2014-05-03 DIAGNOSIS — C7A029 Malignant carcinoid tumor of the large intestine, unspecified portion: Secondary | ICD-10-CM

## 2014-05-03 DIAGNOSIS — Z85820 Personal history of malignant melanoma of skin: Secondary | ICD-10-CM

## 2014-05-03 DIAGNOSIS — I10 Essential (primary) hypertension: Secondary | ICD-10-CM

## 2014-05-03 DIAGNOSIS — Z859 Personal history of malignant neoplasm, unspecified: Secondary | ICD-10-CM

## 2014-05-03 NOTE — Progress Notes (Addendum)
  [place] OFFICE PROGRESS NOTE   Diagnosis:  Stage I melanoma August 1995; stage III carcinoid tumor of the terminal ileum resected December 1997.  INTERVAL HISTORY:   [name] is a 58 year old man with a history of stage I malignant melanoma excised from the lower abdominal wall August 1995, Clark's level I, Breslow depth 1.02 mm. He was diagnosed with a stage III, one lymph node positive, carcinoid tumor of the terminal ileum in 1997.  He is seen today for scheduled followup.  He continues every 6 month follow up with his dermatologist. His next colonoscopy is due in 2016. He reports a good appetite. He denies fever. Occasional sweats. No flushing episodes. He denies diarrhea. Over the past few weeks he has had some problems with constipation. No nausea or vomiting. He reports his Lasix dose was recently adjusted due to increased leg edema.  Objective:  Vital signs in last 24 hours:  Blood pressure 133/82, pulse 85, temperature 98.4 F (36.9 C), temperature source Oral, resp. rate 18, height 6\' 4"  (1.93 m), weight 308 lb 11.2 oz (140.025 kg), SpO2 98.00%.    HEENT: No thrush or ulcerations. Lymphatics: No palpable cervical, supraclavicular, axillary or inguinal lymph nodes. Resp: Lungs clear. Cardio: Regular cardiac rhythm. GI: Abdomen soft. Mild tenderness at the left upper quadrant. No organomegaly. No mass. Vascular: Pitting lower leg edema bilaterally. Skin: Transverse lower abdominal scar with no evidence of local recurrence. Innumerable moles scattered over the trunk.   Lab Results:  Lab Results  Component Value Date   WBC 8.8 04/26/2014   HGB 14.1 04/26/2014   HCT 42.4 04/26/2014   MCV 88.6 04/26/2014   PLT 205 04/26/2014   NEUTROABS 5.8 04/26/2014    Imaging:  04/27/2014 Chest x-ray. Stable chest radiograph. No acute cardiopulmonary disease or evidence of metastatic disease.  Medications: I have reviewed the patient's current  medications.  Assessment/Plan:  1. Stage I malignant melanoma excised from the lower abdominal wall August 1995, Clark's level I, Breslow's depth 1.02 mm. 2. Stage III, one lymph node positive, carcinoid tumor of the terminal ileum status post resection December 1997. 3. History of dysplastic nevi. 4. Diabetes. 5. Hypertension. 6. Hyperlipidemia. 7. GERD.  Disposition: He remains in clinical remission from melanoma and carcinoid. He understands the importance of close followup with dermatology and remaining up-to-date on colonoscopy surveillance.  We did not schedule formal followup in our office. We will be happy to see him in the future as needed.   Patient seen with Dr. Benay Spice.    Owens Shark ANP/GNP-BC   05/03/2014  4:29 PM  This was a shared visit with Ned Card. [name] was interviewed and examined. He has a remote history of melanoma and a carcinoid tumor. He remains in clinical remission. He will continue clinical followup with his primary physician and his cardiologist.. We will see him in the future as needed.  Julieanne Manson, M.D.

## 2014-05-17 ENCOUNTER — Encounter: Payer: Self-pay | Admitting: Internal Medicine

## 2014-07-15 ENCOUNTER — Other Ambulatory Visit: Payer: Self-pay | Admitting: Dermatology

## 2014-07-15 ENCOUNTER — Ambulatory Visit (INDEPENDENT_AMBULATORY_CARE_PROVIDER_SITE_OTHER): Payer: BC Managed Care – PPO | Admitting: Internal Medicine

## 2014-07-15 ENCOUNTER — Encounter: Payer: Self-pay | Admitting: Internal Medicine

## 2014-07-15 VITALS — BP 116/66 | HR 68 | Ht 75.5 in | Wt 308.4 lb

## 2014-07-15 DIAGNOSIS — R1084 Generalized abdominal pain: Secondary | ICD-10-CM

## 2014-07-15 DIAGNOSIS — K219 Gastro-esophageal reflux disease without esophagitis: Secondary | ICD-10-CM

## 2014-07-15 DIAGNOSIS — K5900 Constipation, unspecified: Secondary | ICD-10-CM

## 2014-07-15 NOTE — Patient Instructions (Addendum)
Use Miralax as directed by Dr. Henrene Pastor  Please follow up with Dr. Henrene Pastor on 08/23/2014 at 1:30pm
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
## 2014-08-23 ENCOUNTER — Ambulatory Visit: Payer: BC Managed Care – PPO | Admitting: Internal Medicine

## 2014-09-30 ENCOUNTER — Encounter: Payer: Self-pay | Admitting: Gastroenterology

## 2015-04-19 ENCOUNTER — Other Ambulatory Visit: Payer: Self-pay | Admitting: Dermatology

## 2015-08-21 ENCOUNTER — Encounter (HOSPITAL_COMMUNITY): Payer: Self-pay | Admitting: *Deleted

## 2015-08-21 ENCOUNTER — Inpatient Hospital Stay (HOSPITAL_COMMUNITY)
Admission: RE | Admit: 2015-08-21 | Discharge: 2015-08-22 | DRG: 247 | Disposition: A | Discharge status: Home / Self Care | Payer: Self-pay | Source: Ambulatory Visit | Attending: Cardiology | Admitting: Cardiology

## 2015-08-21 ENCOUNTER — Encounter (HOSPITAL_COMMUNITY)
Admission: RE | Disposition: A | Discharge status: Home / Self Care | Payer: Self-pay | Source: Ambulatory Visit | Attending: Cardiology

## 2015-08-21 DIAGNOSIS — Z881 Allergy status to other antibiotic agents status: Secondary | ICD-10-CM

## 2015-08-21 DIAGNOSIS — I10 Essential (primary) hypertension: Secondary | ICD-10-CM | POA: Diagnosis present

## 2015-08-21 DIAGNOSIS — M1990 Unspecified osteoarthritis, unspecified site: Secondary | ICD-10-CM | POA: Diagnosis present

## 2015-08-21 DIAGNOSIS — Z7982 Long term (current) use of aspirin: Secondary | ICD-10-CM

## 2015-08-21 DIAGNOSIS — D490 Neoplasm of unspecified behavior of digestive system: Secondary | ICD-10-CM | POA: Diagnosis present

## 2015-08-21 DIAGNOSIS — K219 Gastro-esophageal reflux disease without esophagitis: Secondary | ICD-10-CM | POA: Diagnosis present

## 2015-08-21 DIAGNOSIS — Z91030 Bee allergy status: Secondary | ICD-10-CM

## 2015-08-21 DIAGNOSIS — I214 Non-ST elevation (NSTEMI) myocardial infarction: Principal | ICD-10-CM | POA: Diagnosis present

## 2015-08-21 DIAGNOSIS — Z955 Presence of coronary angioplasty implant and graft: Secondary | ICD-10-CM

## 2015-08-21 DIAGNOSIS — Z86010 Personal history of colonic polyps: Secondary | ICD-10-CM

## 2015-08-21 DIAGNOSIS — I249 Acute ischemic heart disease, unspecified: Secondary | ICD-10-CM | POA: Diagnosis present

## 2015-08-21 DIAGNOSIS — E1142 Type 2 diabetes mellitus with diabetic polyneuropathy: Secondary | ICD-10-CM | POA: Diagnosis present

## 2015-08-21 DIAGNOSIS — I2510 Atherosclerotic heart disease of native coronary artery without angina pectoris: Secondary | ICD-10-CM | POA: Diagnosis present

## 2015-08-21 DIAGNOSIS — Z791 Long term (current) use of non-steroidal anti-inflammatories (NSAID): Secondary | ICD-10-CM

## 2015-08-21 DIAGNOSIS — E785 Hyperlipidemia, unspecified: Secondary | ICD-10-CM | POA: Diagnosis present

## 2015-08-21 DIAGNOSIS — Z79899 Other long term (current) drug therapy: Secondary | ICD-10-CM

## 2015-08-21 DIAGNOSIS — Z85820 Personal history of malignant melanoma of skin: Secondary | ICD-10-CM

## 2015-08-21 DIAGNOSIS — IMO0002 Reserved for concepts with insufficient information to code with codable children: Secondary | ICD-10-CM

## 2015-08-21 DIAGNOSIS — F17220 Nicotine dependence, chewing tobacco, uncomplicated: Secondary | ICD-10-CM | POA: Diagnosis present

## 2015-08-21 DIAGNOSIS — E669 Obesity, unspecified: Secondary | ICD-10-CM | POA: Diagnosis present

## 2015-08-21 DIAGNOSIS — Z888 Allergy status to other drugs, medicaments and biological substances status: Secondary | ICD-10-CM

## 2015-08-21 DIAGNOSIS — E1165 Type 2 diabetes mellitus with hyperglycemia: Secondary | ICD-10-CM | POA: Diagnosis present

## 2015-08-21 DIAGNOSIS — E1129 Type 2 diabetes mellitus with other diabetic kidney complication: Secondary | ICD-10-CM

## 2015-08-21 DIAGNOSIS — Z6836 Body mass index (BMI) 36.0-36.9, adult: Secondary | ICD-10-CM

## 2015-08-21 DIAGNOSIS — I25110 Atherosclerotic heart disease of native coronary artery with unstable angina pectoris: Secondary | ICD-10-CM | POA: Diagnosis present

## 2015-08-21 DIAGNOSIS — Z833 Family history of diabetes mellitus: Secondary | ICD-10-CM

## 2015-08-21 HISTORY — PX: CARDIAC CATHETERIZATION: SHX172

## 2015-08-21 LAB — COMPREHENSIVE METABOLIC PANEL
ALBUMIN: 3.9 g/dL (ref 3.5–5.0)
ALT: 53 U/L (ref 17–63)
ANION GAP: 11 (ref 5–15)
AST: 37 U/L (ref 15–41)
Alkaline Phosphatase: 49 U/L (ref 38–126)
BILIRUBIN TOTAL: 0.7 mg/dL (ref 0.3–1.2)
BUN: 15 mg/dL (ref 6–20)
CO2: 24 mmol/L (ref 22–32)
Calcium: 9 mg/dL (ref 8.9–10.3)
Chloride: 102 mmol/L (ref 101–111)
Creatinine, Ser: 1.52 mg/dL — ABNORMAL HIGH (ref 0.61–1.24)
GFR calc Af Amer: 57 mL/min — ABNORMAL LOW (ref >60)
GFR calc non Af Amer: 49 mL/min — ABNORMAL LOW (ref >60)
GLUCOSE: 219 mg/dL — AB (ref 65–99)
POTASSIUM: 3.4 mmol/L — AB (ref 3.5–5.1)
SODIUM: 137 mmol/L (ref 135–145)
TOTAL PROTEIN: 6.8 g/dL (ref 6.5–8.1)

## 2015-08-21 LAB — CBC
HEMATOCRIT: 41.4 % (ref 39.0–52.0)
HEMOGLOBIN: 13.9 g/dL (ref 13.0–17.0)
MCH: 30.2 pg (ref 26.0–34.0)
MCHC: 33.6 g/dL (ref 30.0–36.0)
MCV: 90 fL (ref 78.0–100.0)
Platelets: 230 10*3/uL (ref 150–400)
RBC: 4.6 MIL/uL (ref 4.22–5.81)
RDW: 13.4 % (ref 11.5–15.5)
WBC: 10 10*3/uL (ref 4.0–10.5)

## 2015-08-21 LAB — LIPID PANEL
CHOLESTEROL: 202 mg/dL — AB (ref 0–200)
HDL: 40 mg/dL — ABNORMAL LOW (ref >40)
LDL Cholesterol: 118 mg/dL — ABNORMAL HIGH (ref 0–99)
Total CHOL/HDL Ratio: 5.1 RATIO
Triglycerides: 220 mg/dL — ABNORMAL HIGH (ref <150)
VLDL: 44 mg/dL — AB (ref 0–40)

## 2015-08-21 LAB — CK TOTAL AND CKMB (NOT AT ARMC)
CK TOTAL: 301 U/L (ref 49–397)
CK, MB: 7.5 ng/mL — AB (ref 0.5–5.0)
Relative Index: 2.5 (ref 0.0–2.5)

## 2015-08-21 LAB — GLUCOSE, CAPILLARY: GLUCOSE-CAPILLARY: 276 mg/dL — AB (ref 65–99)

## 2015-08-21 LAB — TROPONIN I

## 2015-08-21 LAB — APTT: APTT: 29 s (ref 24–37)

## 2015-08-21 LAB — PROTIME-INR
INR: 1.1 (ref 0.00–1.49)
Prothrombin Time: 14.4 seconds (ref 11.6–15.2)

## 2015-08-21 LAB — MRSA PCR SCREENING: MRSA by PCR: NEGATIVE

## 2015-08-21 SURGERY — LEFT HEART CATH AND CORONARY ANGIOGRAPHY
Anesthesia: LOCAL

## 2015-08-21 MED ORDER — FAMOTIDINE IN NACL 20-0.9 MG/50ML-% IV SOLN
INTRAVENOUS | Status: AC
  Filled 2015-08-21: qty 50

## 2015-08-21 MED ORDER — SODIUM CHLORIDE 0.9 % IV SOLN: 250.0000 mL | INTRAVENOUS | Status: DC | PRN

## 2015-08-21 MED ORDER — TICAGRELOR 90 MG PO TABS
ORAL_TABLET | ORAL | Status: DC | PRN
  Administered 2015-08-21: 180 mg via ORAL

## 2015-08-21 MED ORDER — ACETAMINOPHEN 325 MG PO TABS
650.0000 mg | ORAL_TABLET | ORAL | Status: DC | PRN
  Administered 2015-08-22: 650 mg via ORAL
  Filled 2015-08-21: qty 2

## 2015-08-21 MED ORDER — HEPARIN SODIUM (PORCINE) 1000 UNIT/ML IJ SOLN
INTRAMUSCULAR | Status: AC
  Filled 2015-08-21: qty 1

## 2015-08-21 MED ORDER — METHYLPREDNISOLONE SODIUM SUCC 125 MG IJ SOLR
INTRAMUSCULAR | Status: AC
  Filled 2015-08-21: qty 2

## 2015-08-21 MED ORDER — CARVEDILOL 6.25 MG PO TABS
6.2500 mg | ORAL_TABLET | Freq: Two times a day (BID) | ORAL | Status: DC
Start: 2015-08-22 — End: 2015-08-22
  Administered 2015-08-22: 6.25 mg via ORAL
  Filled 2015-08-21: qty 1

## 2015-08-21 MED ORDER — INSULIN ASPART 100 UNIT/ML ~~LOC~~ SOLN
6.0000 [IU] | Freq: Three times a day (TID) | SUBCUTANEOUS | Status: DC
  Administered 2015-08-22: 6 [IU] via SUBCUTANEOUS

## 2015-08-21 MED ORDER — ATORVASTATIN CALCIUM 80 MG PO TABS: 80.0000 mg | ORAL_TABLET | Freq: Every day | ORAL | Status: DC

## 2015-08-21 MED ORDER — BIVALIRUDIN BOLUS VIA INFUSION - CUPID
INTRAVENOUS | Status: DC | PRN
  Administered 2015-08-21: 105 mg via INTRAVENOUS

## 2015-08-21 MED ORDER — POLYETHYLENE GLYCOL 3350 17 GM/SCOOP PO POWD
17.0000 g | ORAL | Status: DC | PRN
  Filled 2015-08-21: qty 255

## 2015-08-21 MED ORDER — INSULIN DETEMIR 100 UNIT/ML ~~LOC~~ SOLN
100.0000 [IU] | Freq: Every day | SUBCUTANEOUS | Status: DC
  Administered 2015-08-21: 100 [IU] via SUBCUTANEOUS
  Filled 2015-08-21 (×2): qty 1

## 2015-08-21 MED ORDER — SODIUM CHLORIDE 0.9 % WEIGHT BASED INFUSION
3.0000 mL/kg/h | INTRAVENOUS | Status: AC
  Administered 2015-08-21: 3 mL/kg/h via INTRAVENOUS

## 2015-08-21 MED ORDER — BIVALIRUDIN 250 MG IV SOLR
INTRAVENOUS | Status: AC
  Filled 2015-08-21: qty 250

## 2015-08-21 MED ORDER — OXYCODONE-ACETAMINOPHEN 5-325 MG PO TABS
1.0000 | ORAL_TABLET | ORAL | Status: DC | PRN
  Administered 2015-08-21: 1 via ORAL
  Filled 2015-08-21: qty 1

## 2015-08-21 MED ORDER — DOCUSATE SODIUM 100 MG PO CAPS
100.0000 mg | ORAL_CAPSULE | Freq: Two times a day (BID) | ORAL | Status: DC
  Administered 2015-08-21: 100 mg via ORAL
  Filled 2015-08-21: qty 1

## 2015-08-21 MED ORDER — HEPARIN SODIUM (PORCINE) 1000 UNIT/ML IJ SOLN
INTRAMUSCULAR | Status: DC | PRN
Start: 2015-08-21 — End: 2015-08-21
  Administered 2015-08-21: 5000 [IU] via INTRAVENOUS

## 2015-08-21 MED ORDER — NITROGLYCERIN 1 MG/10 ML FOR IR/CATH LAB
INTRA_ARTERIAL | Status: DC | PRN
  Administered 2015-08-21: 19:00:00

## 2015-08-21 MED ORDER — TICAGRELOR 90 MG PO TABS
90.0000 mg | ORAL_TABLET | Freq: Two times a day (BID) | ORAL | Status: DC
  Administered 2015-08-21 – 2015-08-22 (×2): 90 mg via ORAL
  Filled 2015-08-21 (×2): qty 1

## 2015-08-21 MED ORDER — ASPIRIN 81 MG PO CHEW
81.0000 mg | CHEWABLE_TABLET | Freq: Every day | ORAL | Status: DC
  Administered 2015-08-22: 81 mg via ORAL
  Filled 2015-08-21: qty 1

## 2015-08-21 MED ORDER — TICAGRELOR 90 MG PO TABS
ORAL_TABLET | ORAL | Status: AC
  Filled 2015-08-21: qty 2

## 2015-08-21 MED ORDER — SODIUM CHLORIDE 0.9 % IJ SOLN: 3.0000 mL | INTRAMUSCULAR | Status: DC | PRN

## 2015-08-21 MED ORDER — VERAPAMIL HCL 2.5 MG/ML IV SOLN
INTRAVENOUS | Status: AC
  Filled 2015-08-21: qty 2

## 2015-08-21 MED ORDER — METHYLPREDNISOLONE SODIUM SUCC 125 MG IJ SOLR
INTRAMUSCULAR | Status: DC | PRN
  Administered 2015-08-21: 125 mg via INTRAVENOUS

## 2015-08-21 MED ORDER — SODIUM CHLORIDE 0.9 % IV SOLN
250.0000 mg | INTRAVENOUS | Status: DC | PRN
  Administered 2015-08-21: 1.75 mg/kg/h via INTRAVENOUS

## 2015-08-21 MED ORDER — IOHEXOL 350 MG/ML SOLN
INTRAVENOUS | Status: DC | PRN
  Administered 2015-08-21: 70 mL via INTRACARDIAC

## 2015-08-21 MED ORDER — INSULIN ASPART 100 UNIT/ML ~~LOC~~ SOLN
0.0000 [IU] | Freq: Three times a day (TID) | SUBCUTANEOUS | Status: DC
  Administered 2015-08-22: 15 [IU] via SUBCUTANEOUS

## 2015-08-21 MED ORDER — ONDANSETRON HCL 4 MG/2ML IJ SOLN: 4.0000 mg | Freq: Four times a day (QID) | INTRAMUSCULAR | Status: DC | PRN

## 2015-08-21 MED ORDER — DIPHENHYDRAMINE HCL 50 MG/ML IJ SOLN
INTRAMUSCULAR | Status: AC
  Filled 2015-08-21: qty 1

## 2015-08-21 MED ORDER — RADIAL COCKTAIL (HEPARIN/VERAPAMIL/LIDOCAINE/NITRO)
Status: DC | PRN
  Administered 2015-08-21 (×2): 10 mL via INTRA_ARTERIAL

## 2015-08-21 MED ORDER — DIAZEPAM 5 MG PO TABS: 5.0000 mg | ORAL_TABLET | Freq: Four times a day (QID) | ORAL | Status: DC | PRN

## 2015-08-21 MED ORDER — NITROGLYCERIN 1 MG/10 ML FOR IR/CATH LAB
INTRA_ARTERIAL | Status: DC | PRN
Start: 2015-08-21 — End: 2015-08-21
  Administered 2015-08-21 (×2): 200 ug via INTRACORONARY

## 2015-08-21 MED ORDER — PANTOPRAZOLE SODIUM 40 MG PO TBEC
40.0000 mg | DELAYED_RELEASE_TABLET | Freq: Every day | ORAL | Status: DC
  Administered 2015-08-22: 40 mg via ORAL
  Filled 2015-08-21: qty 1

## 2015-08-21 MED ORDER — POTASSIUM CHLORIDE 20 MEQ/15ML (10%) PO SOLN
40.0000 meq | Freq: Two times a day (BID) | ORAL | Status: DC
  Administered 2015-08-21: 40 meq via ORAL
  Filled 2015-08-21: qty 30

## 2015-08-21 MED ORDER — HYDROCORTISONE NA SUCCINATE PF 100 MG IJ SOLR
100.0000 mg | INTRAMUSCULAR | Status: AC
  Administered 2015-08-21: 100 mg via INTRAVENOUS
  Filled 2015-08-21: qty 2

## 2015-08-21 MED ORDER — DIPHENHYDRAMINE HCL 50 MG/ML IJ SOLN
INTRAMUSCULAR | Status: DC | PRN
  Administered 2015-08-21: 25 mg via INTRAVENOUS

## 2015-08-21 MED ORDER — POLYETHYLENE GLYCOL 3350 17 G PO PACK: 17.0000 g | PACK | Freq: Every day | ORAL | Status: DC | PRN

## 2015-08-21 MED ORDER — SODIUM CHLORIDE 0.9 % IJ SOLN
3.0000 mL | Freq: Two times a day (BID) | INTRAMUSCULAR | Status: DC
  Administered 2015-08-21 – 2015-08-22 (×2): 3 mL via INTRAVENOUS

## 2015-08-21 MED ORDER — SODIUM CHLORIDE 0.9 % IV SOLN
1.7500 mg/kg/h | Freq: Once | INTRAVENOUS | Status: AC
  Administered 2015-08-21: 1.75 mg/kg/h via INTRAVENOUS
  Filled 2015-08-21: qty 250

## 2015-08-21 MED ORDER — HYDROMORPHONE HCL 1 MG/ML IJ SOLN: 0.5000 mg | INTRAMUSCULAR | Status: DC | PRN

## 2015-08-21 MED ORDER — TOPIRAMATE 25 MG PO TABS
50.0000 mg | ORAL_TABLET | Freq: Two times a day (BID) | ORAL | Status: DC
  Administered 2015-08-21 – 2015-08-22 (×2): 50 mg via ORAL
  Filled 2015-08-21 (×4): qty 2

## 2015-08-21 MED ORDER — FAMOTIDINE IN NACL 20-0.9 MG/50ML-% IV SOLN
INTRAVENOUS | Status: DC | PRN
  Administered 2015-08-21: 20 mg via INTRAVENOUS

## 2015-08-21 SURGICAL SUPPLY — 15 items
BALLN EUPHORA RX 2.0X15 (BALLOONS) ×2
BALLOON EUPHORA RX 2.0X15 (BALLOONS) IMPLANT
CATH OPTITORQUE TIG 4.0 5F (CATHETERS) ×2 IMPLANT
CATH VISTA GUIDE 6FR XBLAD4 (CATHETERS) ×1 IMPLANT
DEVICE RAD COMP TR BAND LRG (VASCULAR PRODUCTS) ×2 IMPLANT
GLIDESHEATH SLEND A-KIT 6F 20G (SHEATH) ×2 IMPLANT
HOVERMATT SINGLE USE (MISCELLANEOUS) ×1 IMPLANT
KIT ENCORE 26 ADVANTAGE (KITS) ×1 IMPLANT
KIT HEART LEFT (KITS) ×2 IMPLANT
PACK CARDIAC CATHETERIZATION (CUSTOM PROCEDURE TRAY) ×2 IMPLANT
STENT SYNERGY DES 2.5X24 (Permanent Stent) ×1 IMPLANT
TRANSDUCER W/STOPCOCK (MISCELLANEOUS) ×2 IMPLANT
TUBING CIL FLEX 10 FLL-RA (TUBING) ×2 IMPLANT
WIRE HI TORQ BMW 190CM (WIRE) ×1 IMPLANT
WIRE SAFE-T 1.5MM-J .035X260CM (WIRE) ×2 IMPLANT

## 2015-08-21 NOTE — Progress Notes (Signed)
Pt complaining of chest pain/arm pain rated 6/10 and feeling "flushed." EKG showed NSR with PAC's. Pt given percocet 5/325 and Dr. Einar Gip notified. Received order for dilaudid 0.5mg  IV Q4 PRN. Will continue to monitor.
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
## 2015-08-21 NOTE — Progress Notes (Signed)
Pt arrived to unit without any medications from home.

## 2015-08-21 NOTE — Progress Notes (Signed)
eLink Physician-Brief Progress Note Patient Name: [name] DOB: February 19, 1956 MRN: [id number]   Date of Service  08/21/2015  HPI/Events of Note  59 Y/O admitted with chest pain, EKG changes. Taken to cath lab s/p 1st diagonal LAD stent placement. Stable post procedure.  eICU Interventions  K repleted.     Intervention Category Major Interventions: Other:  Shantee Hayne 08/21/2015, 8:51 PM

## 2015-08-22 ENCOUNTER — Encounter (HOSPITAL_COMMUNITY): Payer: Self-pay | Admitting: Cardiology

## 2015-08-22 LAB — CBC
HCT: 41.6 % (ref 39.0–52.0)
HEMOGLOBIN: 13.8 g/dL (ref 13.0–17.0)
MCH: 29.9 pg (ref 26.0–34.0)
MCHC: 33.2 g/dL (ref 30.0–36.0)
MCV: 90 fL (ref 78.0–100.0)
PLATELETS: 226 10*3/uL (ref 150–400)
RBC: 4.62 MIL/uL (ref 4.22–5.81)
RDW: 13.5 % (ref 11.5–15.5)
WBC: 9.7 10*3/uL (ref 4.0–10.5)

## 2015-08-22 LAB — POCT I-STAT, CHEM 8
BUN: 17 mg/dL (ref 6–20)
CHLORIDE: 103 mmol/L (ref 101–111)
Calcium, Ion: 1.18 mmol/L (ref 1.12–1.23)
Creatinine, Ser: 1.4 mg/dL — ABNORMAL HIGH (ref 0.61–1.24)
GLUCOSE: 228 mg/dL — AB (ref 65–99)
HEMATOCRIT: 43 % (ref 39.0–52.0)
HEMOGLOBIN: 14.6 g/dL (ref 13.0–17.0)
POTASSIUM: 3.5 mmol/L (ref 3.5–5.1)
SODIUM: 139 mmol/L (ref 135–145)
TCO2: 22 mmol/L (ref 0–100)

## 2015-08-22 LAB — TROPONIN I
TROPONIN I: 0.42 ng/mL — AB (ref <0.031)
Troponin I: 0.31 ng/mL — ABNORMAL HIGH (ref <0.031)

## 2015-08-22 LAB — BASIC METABOLIC PANEL
ANION GAP: 9 (ref 5–15)
BUN: 14 mg/dL (ref 6–20)
CHLORIDE: 102 mmol/L (ref 101–111)
CO2: 22 mmol/L (ref 22–32)
Calcium: 8.8 mg/dL — ABNORMAL LOW (ref 8.9–10.3)
Creatinine, Ser: 1.44 mg/dL — ABNORMAL HIGH (ref 0.61–1.24)
GFR calc non Af Amer: 53 mL/min — ABNORMAL LOW (ref >60)
Glucose, Bld: 317 mg/dL — ABNORMAL HIGH (ref 65–99)
Potassium: 3.8 mmol/L (ref 3.5–5.1)
SODIUM: 133 mmol/L — AB (ref 135–145)

## 2015-08-22 LAB — HEMOGLOBIN A1C
HEMOGLOBIN A1C: 8.4 % — AB (ref 4.8–5.6)
MEAN PLASMA GLUCOSE: 194 mg/dL

## 2015-08-22 LAB — GLUCOSE, CAPILLARY: Glucose-Capillary: 301 mg/dL — ABNORMAL HIGH (ref 65–99)

## 2015-08-22 LAB — TSH: TSH: 0.564 u[IU]/mL (ref 0.350–4.500)

## 2015-08-22 LAB — POCT ACTIVATED CLOTTING TIME: ACTIVATED CLOTTING TIME: 564 s

## 2015-08-22 MED ORDER — METFORMIN HCL 500 MG PO TABS: 2000.0000 mg | ORAL_TABLET | Freq: Every day | ORAL | Status: AC

## 2015-08-22 MED ORDER — POTASSIUM CHLORIDE CRYS ER 20 MEQ PO TBCR
40.0000 meq | EXTENDED_RELEASE_TABLET | Freq: Once | ORAL | Status: AC
  Administered 2015-08-22: 40 meq via ORAL
  Filled 2015-08-22: qty 2

## 2015-08-22 MED ORDER — TICAGRELOR 90 MG PO TABS: 90.0000 mg | ORAL_TABLET | Freq: Two times a day (BID) | ORAL | Status: DC

## 2015-08-22 MED ORDER — CARVEDILOL 6.25 MG PO TABS
6.2500 mg | ORAL_TABLET | Freq: Two times a day (BID) | ORAL | Status: DC
Start: 2015-08-22 — End: 2019-09-15

## 2015-08-22 MED ORDER — ATORVASTATIN CALCIUM 80 MG PO TABS: 80.0000 mg | ORAL_TABLET | Freq: Every day | ORAL | Status: DC

## 2015-08-22 NOTE — Progress Notes (Signed)
D/C instructions given to patient and verbalizes understanding. PIV *2 d/c'd by Dereck Leep. Pt d/c'd via WC to private vehicle. Pt with no complaints at the current time.

## 2015-08-22 NOTE — Care Management Note (Signed)
Case Management Note  Patient Details  Name: [name] MRN: [id number] Date of Birth: [birth year]  Subjective/Objective:    Adm w mi                Action/Plan: lives at home, pcp dr Cyndi Bender   Expected Discharge Date:                  Expected Discharge Plan:  Home/Self Care  In-House Referral:     Discharge planning Services  CM Consult, Medication Assistance  Post Acute Care Choice:    Choice offered to:     DME Arranged:    DME Agency:     HH Arranged:    [place] Agency:     Status of Service:     Medicare Important Message Given:    Date Medicare IM Given:    Medicare IM give by:    Date Additional Medicare IM Given:    Additional Medicare Important Message give by:     If discussed at [place] of Stay Meetings, dates discussed:    Additional Comments: gave pt 30 day free brilinta card and gave pt pt assist form. He has no ins but has worked out getting all of his meds. He has pcp. Will fill out pt asssit form for brlinta and furnish proof of income and get md to sign and get md to fax in at visit. Explained again importance of taking brilinta.  Lacretia Leigh, RN 08/22/2015, 9:57 AM

## 2015-08-22 NOTE — Discharge Summary (Signed)
Physician Discharge Summary  Patient ID: [name] MRN: [id number] DOB/AGE: 02-05-1956 59 y.o.  Admit date: 08/21/2015 Discharge date: 08/22/2015  Primary Discharge Diagnosis 1.  Acute non-ST elevation myocardial infarction involving anterolateral wall 2.  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  Diabetes mellitus type 2 uncontrolled with peripheral neuropathy 4.  Hyperlipidemia 5.  Hypertension 6.  Moderate obesity  Significant Diagnostic Studies: 08/21/2015: Coronary angiography:  Mild to moderate diffuse coronary artery disease involving all 3 vessels, moderate calcification of left main and proximal LAD, mid LAD 40%, distal LAD 30-40%, large D1 with ulcerated 99% stenosis reduced to 0% by stenting of the D1 branch of LAD with implantation of a 2.5 x 24 mm Synergy DES. LVEF preserved at 50-55% with mid anterolateral hypokinesis.  Hospital Course: Patient presented on 08/22/2015. [name] is a 59 y.o. male With history of uncontrolled diabetes mellitus, hypertension, history of GERD, and carcinoid syndrome, s/p distal jejunum resection doing well until the evening around 5:30 PM of severe chest pain associated shortness of breath and diaphoresis. EKG performed by the EMS revealed ST elevation in 1 and aVL with reciprocal depression in the inferior-lateral leads. He was emergently transferred to [facility] for emergent coronary angiography.  On route to the hospital, patient received sublingual nitroglycerin, with immediate relief of chest pain but had another recurrence for which he received seconds abnormal nitroglycerin. Since then patient's chest pain completely resolved. On presentation to the cath lab, patient was completely chest pain-free. Repeat EKG done by the EMS revealed normal sinus rhythm without any acute ST elevations.  On further questioning, patient states that he has anaphylaxis to contrast with severe dyspnea that  occurs within 10 minutes of contrast administration associated with marked diaphoresis and also rash.  We had decided to cancel the procedure, however patient stated that he is having recurrence of chest pain and similar pain on the arms that he initially presented with. Hence we proceeded with emergent angiography, patient was premedicated and also given extra dose of hydrocortisone for quick action.  Patient tolerated coronary angiography without any allergic reaction to contrast. He underwent successful angioplasty to the large diagonal branch of the LAD. Overnight he remained chest pain-free except one episode. EKG essentially revealed no evidence of ischemia and back to baseline. Serum troponin minimally elevated. His serum creatinine remains stable. Felt stable for discharge with outpatient follow-up with aggressive risk factor modification.  Recommendations on discharge: Patient will need BRILINTA for at least a period of one year or longer. Patient has mild to moderate diffuse coronary artery disease throughout the 3 vessels, LVEF is preserved with mild mid anterolateral hypokinesis. I have had a long discussion regarding lifestyle and therapeutic changes. He will need diabetic education.  Discharge Exam: Blood pressure 120/73, pulse 67, temperature 97.9 F (36.6 C), temperature source Oral, resp. rate 15, height 6\' 4"  (1.93 m), weight 137.8 kg (303 lb 12.7 oz), SpO2 99 %.  General appearance: alert, cooperative, no distress and moderately obese Eyes: negative findings: lids and lashes normal and Wears glasses Neck: no carotid bruit, no JVD, supple, symmetrical, trachea midline and thyroid not enlarged, symmetric, no tenderness/mass/nodules Neck: JVP - normal, carotids 2+= without bruits Resp: clear to auscultation bilaterally Chest wall: no tenderness Cardio: regular rate and rhythm, S1, S2 normal, no murmur, click, rub or gallop GI: soft, non-tender; bowel sounds normal; no masses, no  organomegaly and obese Extremities: 1-2+ below-knee edema, on the right and trace on the  left, pitting. Full range of movements. Pulses: 2+ and symmetric, except dorsalis pedis is 1+ bilateral. Skin: Skin color, texture, turgor normal. No rashes or lesions Neurologic: Grossly normal  Labs:   Lab Results  Component Value Date   WBC 9.7 08/22/2015   HGB 13.8 08/22/2015   HCT 41.6 08/22/2015   MCV 90.0 08/22/2015   PLT 226 08/22/2015    Recent Labs Lab 08/21/15 1950 08/22/15 0114  NA 137 133*  K 3.4* 3.8  CL 102 102  CO2 24 22  BUN 15 14  CREATININE 1.52* 1.44*  CALCIUM 9.0 8.8*  PROT 6.8  --   BILITOT 0.7  --   ALKPHOS 49  --   ALT 53  --   AST 37  --   GLUCOSE 219* 317*    Lipid Panel     Component Value Date/Time   CHOL 202* 08/21/2015 1956   TRIG 220* 08/21/2015 1956   HDL 40* 08/21/2015 1956   CHOLHDL 5.1 08/21/2015 1956   VLDL 44* 08/21/2015 1956   LDLCALC 118* 08/21/2015 1956    HEMOGLOBIN A1C Lab Results  Component Value Date   HGBA1C 8.4* 08/21/2015   MPG 194 08/21/2015    Cardiac Panel (last 3 results)  Recent Labs  08/21/15 1950 08/22/15 0114  CKTOTAL 301  --   CKMB 7.5*  --   TROPONINI <0.03 0.42*  RELINDX 2.5  --     Lab Results  Component Value Date   CKTOTAL 301 08/21/2015   CKMB 7.5* 08/21/2015   TROPONINI 0.42* 08/22/2015     TSH  Recent Labs  08/22/15 0114  TSH 0.564    EKG: on admission on 08/21/2015 by the EMS revealed ST elevation in 1 and aVL, ST depression in inferior leads and lateral leads suggestive of acute high lateral injury pattern. Left axis deviation and left anterior fascicular block.  EKG 08/22/2015: Normal sinus rhythm, left axis deviation, left and to fascicular block, no evidence of ischemia.    FOLLOW UP PLANS AND APPOINTMENTS    Medication List    TAKE these medications        aspirin 81 MG tablet  Take 81 mg by mouth daily.     atorvastatin 80 MG tablet  Commonly known as:  LIPITOR   Take 1 tablet (80 mg total) by mouth daily at 6 PM.     carvedilol 6.25 MG tablet  Commonly known as:  COREG  Take 1 tablet (6.25 mg total) by mouth 2 (two) times daily with a meal.     furosemide 40 MG tablet  Commonly known as:  LASIX  Take 60 mg by mouth 2 (two) times daily.     insulin aspart 100 UNIT/ML injection  Commonly known as:  novoLOG  Inject 40 Units into the skin daily.     insulin glargine 100 UNIT/ML injection  Commonly known as:  LANTUS  Inject 160 Units into the skin at bedtime.     losartan-hydrochlorothiazide 50-12.5 MG per tablet  Commonly known as:  HYZAAR  Take 1 tablet by mouth daily.     metFORMIN 500 MG tablet  Commonly known as:  GLUCOPHAGE  Take 4 tablets (2,000 mg total) by mouth daily.  Start taking on:  08/24/2015     naproxen 500 MG tablet  Commonly known as:  NAPROSYN  Take 500 mg by mouth 2 (two) times daily with a meal.     omeprazole 40 MG capsule  Commonly known as:  PRILOSEC  Take 40 mg by mouth daily.     potassium chloride SA 20 MEQ tablet  Commonly known as:  K-DUR,KLOR-CON  Take 20 mEq by mouth 3 (three) times daily.     ticagrelor 90 MG Tabs tablet  Commonly known as:  BRILINTA  Take 1 tablet (90 mg total) by mouth 2 (two) times daily.     TOPAMAX 25 MG tablet  Generic drug:  topiramate  Take 50 mg by mouth 2 (two) times daily.           Follow-up Information    Follow up with Adrian Prows, MD On 09/06/2015.   Specialty:  Cardiology   Why:  10:30. A<. Bring all medications   Contact information:   [place] [place] [address] [phone]      Adrian Prows, MD 08/22/2015, 9:24 AM  Pager: [phone] Office: [phone] If no answer: [phone]

## 2015-08-22 NOTE — Progress Notes (Signed)
Chaplain was paged for a code STEMI. Pt was taken directly to [facility] lab. Chaplain assisted EMS to transport  Pt to Cath lab.Chaplain left upon entering Cath.   08/22/15 0600  Clinical Encounter Type  Visited With Patient  Visit Type Spiritual support  Referral From Care management  Spiritual Encounters  Spiritual Needs Emotional

## 2015-08-22 NOTE — Care Management Note (Signed)
Case Management Note  Patient Details  Name: [name] MRN: [id number] Date of Birth: September 16, 1956  Subjective/Objective:      Adm w mi              Action/Plan: lives w wife, pcp dr Cyndi Bender   Expected Discharge Date:                  Expected Discharge Plan:  Home/Self Care  In-House Referral:     Discharge planning Services     Post Acute Care Choice:    Choice offered to:     DME Arranged:    DME Agency:     HH Arranged:    [place] Agency:     Status of Service:     Medicare Important Message Given:    Date Medicare IM Given:    Medicare IM give by:    Date Additional Medicare IM Given:    Additional Medicare Important Message give by:     If discussed at [place] of Stay Meetings, dates discussed:    Additional Comments:ur review done  Lacretia Leigh, RN 08/22/2015, 9:10 AM

## 2015-08-22 NOTE — Progress Notes (Signed)
CARDIAC REHAB PHASE I   PRE:  Rate/Rhythm: 72 SR with PACs    BP: sitting 145/62    SaO2: 97 RA  MODE:  Ambulation: 340 ft   POST:  Rate/Rhythm: 85 SR    BP: sitting 151/66     SaO2: 99 RA  Tolerated well, no c/o. Less PACs walking. Ed completed with pt and mother. Diet change seems difficult to him as he is a "meat and potatoes" guy. Encouraged pt to try new vegetables with an open mindset. Highly encouraged more ex and will refer to [facility] in [place]. Understands importance of brilinta and pt able to perform teach back. [phone]   Josephina Shih Cool CES, ACSM 08/22/2015 11:19 AM

## 2015-08-25 ENCOUNTER — Inpatient Hospital Stay (HOSPITAL_COMMUNITY)
Admission: EM | Admit: 2015-08-25 | Discharge: 2015-08-26 | DRG: 683 | Disposition: A | Discharge status: Home / Self Care | Payer: Self-pay | Attending: Internal Medicine | Admitting: Internal Medicine

## 2015-08-25 ENCOUNTER — Encounter (HOSPITAL_COMMUNITY): Payer: Self-pay | Admitting: Emergency Medicine

## 2015-08-25 ENCOUNTER — Emergency Department (HOSPITAL_COMMUNITY): Payer: Self-pay

## 2015-08-25 DIAGNOSIS — Z881 Allergy status to other antibiotic agents status: Secondary | ICD-10-CM

## 2015-08-25 DIAGNOSIS — N183 Chronic kidney disease, stage 3 (moderate): Secondary | ICD-10-CM | POA: Diagnosis present

## 2015-08-25 DIAGNOSIS — Z85820 Personal history of malignant melanoma of skin: Secondary | ICD-10-CM

## 2015-08-25 DIAGNOSIS — Z79899 Other long term (current) drug therapy: Secondary | ICD-10-CM

## 2015-08-25 DIAGNOSIS — N179 Acute kidney failure, unspecified: Principal | ICD-10-CM | POA: Diagnosis present

## 2015-08-25 DIAGNOSIS — R110 Nausea: Secondary | ICD-10-CM | POA: Diagnosis present

## 2015-08-25 DIAGNOSIS — R079 Chest pain, unspecified: Secondary | ICD-10-CM | POA: Diagnosis present

## 2015-08-25 DIAGNOSIS — Z91030 Bee allergy status: Secondary | ICD-10-CM

## 2015-08-25 DIAGNOSIS — E1165 Type 2 diabetes mellitus with hyperglycemia: Secondary | ICD-10-CM | POA: Diagnosis present

## 2015-08-25 DIAGNOSIS — E1142 Type 2 diabetes mellitus with diabetic polyneuropathy: Secondary | ICD-10-CM | POA: Diagnosis present

## 2015-08-25 DIAGNOSIS — E1121 Type 2 diabetes mellitus with diabetic nephropathy: Secondary | ICD-10-CM | POA: Diagnosis present

## 2015-08-25 DIAGNOSIS — E1140 Type 2 diabetes mellitus with diabetic neuropathy, unspecified: Secondary | ICD-10-CM | POA: Diagnosis present

## 2015-08-25 DIAGNOSIS — E1143 Type 2 diabetes mellitus with diabetic autonomic (poly)neuropathy: Secondary | ICD-10-CM | POA: Diagnosis present

## 2015-08-25 DIAGNOSIS — Z8249 Family history of ischemic heart disease and other diseases of the circulatory system: Secondary | ICD-10-CM

## 2015-08-25 DIAGNOSIS — Z955 Presence of coronary angioplasty implant and graft: Secondary | ICD-10-CM

## 2015-08-25 DIAGNOSIS — Z86010 Personal history of colonic polyps: Secondary | ICD-10-CM

## 2015-08-25 DIAGNOSIS — Z794 Long term (current) use of insulin: Secondary | ICD-10-CM

## 2015-08-25 DIAGNOSIS — E1122 Type 2 diabetes mellitus with diabetic chronic kidney disease: Secondary | ICD-10-CM | POA: Diagnosis present

## 2015-08-25 DIAGNOSIS — K219 Gastro-esophageal reflux disease without esophagitis: Secondary | ICD-10-CM | POA: Diagnosis present

## 2015-08-25 DIAGNOSIS — Z6836 Body mass index (BMI) 36.0-36.9, adult: Secondary | ICD-10-CM

## 2015-08-25 DIAGNOSIS — Z833 Family history of diabetes mellitus: Secondary | ICD-10-CM

## 2015-08-25 DIAGNOSIS — R112 Nausea with vomiting, unspecified: Secondary | ICD-10-CM | POA: Diagnosis present

## 2015-08-25 DIAGNOSIS — M1990 Unspecified osteoarthritis, unspecified site: Secondary | ICD-10-CM | POA: Diagnosis present

## 2015-08-25 DIAGNOSIS — I2510 Atherosclerotic heart disease of native coronary artery without angina pectoris: Secondary | ICD-10-CM | POA: Diagnosis present

## 2015-08-25 DIAGNOSIS — K3184 Gastroparesis: Secondary | ICD-10-CM | POA: Diagnosis present

## 2015-08-25 DIAGNOSIS — R51 Headache: Secondary | ICD-10-CM | POA: Diagnosis present

## 2015-08-25 DIAGNOSIS — I252 Old myocardial infarction: Secondary | ICD-10-CM

## 2015-08-25 DIAGNOSIS — IMO0002 Reserved for concepts with insufficient information to code with codable children: Secondary | ICD-10-CM | POA: Diagnosis present

## 2015-08-25 DIAGNOSIS — I25110 Atherosclerotic heart disease of native coronary artery with unstable angina pectoris: Secondary | ICD-10-CM | POA: Diagnosis present

## 2015-08-25 DIAGNOSIS — I129 Hypertensive chronic kidney disease with stage 1 through stage 4 chronic kidney disease, or unspecified chronic kidney disease: Secondary | ICD-10-CM | POA: Diagnosis present

## 2015-08-25 DIAGNOSIS — N419 Inflammatory disease of prostate, unspecified: Secondary | ICD-10-CM

## 2015-08-25 DIAGNOSIS — Z9049 Acquired absence of other specified parts of digestive tract: Secondary | ICD-10-CM | POA: Diagnosis present

## 2015-08-25 DIAGNOSIS — E860 Dehydration: Secondary | ICD-10-CM | POA: Diagnosis present

## 2015-08-25 DIAGNOSIS — Z91041 Radiographic dye allergy status: Secondary | ICD-10-CM

## 2015-08-25 DIAGNOSIS — E785 Hyperlipidemia, unspecified: Secondary | ICD-10-CM | POA: Diagnosis present

## 2015-08-25 DIAGNOSIS — Z7982 Long term (current) use of aspirin: Secondary | ICD-10-CM

## 2015-08-25 DIAGNOSIS — I10 Essential (primary) hypertension: Secondary | ICD-10-CM | POA: Diagnosis present

## 2015-08-25 LAB — BASIC METABOLIC PANEL
ANION GAP: 14 (ref 5–15)
BUN: 27 mg/dL — ABNORMAL HIGH (ref 6–20)
CHLORIDE: 94 mmol/L — AB (ref 101–111)
CO2: 25 mmol/L (ref 22–32)
Calcium: 9.2 mg/dL (ref 8.9–10.3)
Creatinine, Ser: 1.87 mg/dL — ABNORMAL HIGH (ref 0.61–1.24)
GFR calc non Af Amer: 38 mL/min — ABNORMAL LOW (ref >60)
GFR, EST AFRICAN AMERICAN: 44 mL/min — AB (ref >60)
Glucose, Bld: 215 mg/dL — ABNORMAL HIGH (ref 65–99)
POTASSIUM: 3.4 mmol/L — AB (ref 3.5–5.1)
SODIUM: 133 mmol/L — AB (ref 135–145)

## 2015-08-25 LAB — CBC
HEMATOCRIT: 45.5 % (ref 39.0–52.0)
HEMOGLOBIN: 15.6 g/dL (ref 13.0–17.0)
MCH: 30.4 pg (ref 26.0–34.0)
MCHC: 34.3 g/dL (ref 30.0–36.0)
MCV: 88.5 fL (ref 78.0–100.0)
PLATELETS: 273 10*3/uL (ref 150–400)
RBC: 5.14 MIL/uL (ref 4.22–5.81)
RDW: 13.4 % (ref 11.5–15.5)
WBC: 14.7 10*3/uL — AB (ref 4.0–10.5)

## 2015-08-25 LAB — LIPASE, BLOOD: LIPASE: 25 U/L (ref 22–51)

## 2015-08-25 LAB — I-STAT TROPONIN, ED: Troponin i, poc: 0.05 ng/mL (ref 0.00–0.08)

## 2015-08-25 MED ORDER — SODIUM CHLORIDE 0.9 % IV BOLUS (SEPSIS)
1000.0000 mL | Freq: Once | INTRAVENOUS | Status: AC
  Administered 2015-08-25: 1000 mL via INTRAVENOUS

## 2015-08-25 MED ORDER — MORPHINE SULFATE (PF) 4 MG/ML IV SOLN
4.0000 mg | Freq: Once | INTRAVENOUS | Status: AC
  Administered 2015-08-25: 4 mg via INTRAVENOUS
  Filled 2015-08-25: qty 1
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
## 2015-08-25 NOTE — ED Notes (Signed)
Pt given 2 mouth sponges with a small amount of water in a cup[
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
## 2015-08-25 NOTE — ED Notes (Addendum)
Pt states he had MI on Monday with stent placement.  C/o pressure to L chest, bilateral shoulders, and posterior neck.  States it feels like something is pushing down on shoulders.  Also reports nausea, vomiting, and SOB with exertion.  States blood sugars have been low for him (90 or less) over the past 2 days without diabetes medications/insulin.

## 2015-08-25 NOTE — ED Notes (Addendum)
CHECKED CBG 215 RN Mikki Santee informed

## 2015-08-26 ENCOUNTER — Inpatient Hospital Stay (HOSPITAL_COMMUNITY): Payer: Self-pay

## 2015-08-26 DIAGNOSIS — E860 Dehydration: Secondary | ICD-10-CM

## 2015-08-26 DIAGNOSIS — E1121 Type 2 diabetes mellitus with diabetic nephropathy: Secondary | ICD-10-CM

## 2015-08-26 DIAGNOSIS — N419 Inflammatory disease of prostate, unspecified: Secondary | ICD-10-CM

## 2015-08-26 DIAGNOSIS — N410 Acute prostatitis: Secondary | ICD-10-CM

## 2015-08-26 DIAGNOSIS — R079 Chest pain, unspecified: Secondary | ICD-10-CM

## 2015-08-26 DIAGNOSIS — N179 Acute kidney failure, unspecified: Principal | ICD-10-CM

## 2015-08-26 LAB — COMPREHENSIVE METABOLIC PANEL
ALBUMIN: 4 g/dL (ref 3.5–5.0)
ALT: 48 U/L (ref 17–63)
ANION GAP: 10 (ref 5–15)
AST: 31 U/L (ref 15–41)
Alkaline Phosphatase: 46 U/L (ref 38–126)
BUN: 27 mg/dL — ABNORMAL HIGH (ref 6–20)
CHLORIDE: 96 mmol/L — AB (ref 101–111)
CO2: 28 mmol/L (ref 22–32)
Calcium: 8.9 mg/dL (ref 8.9–10.3)
Creatinine, Ser: 1.81 mg/dL — ABNORMAL HIGH (ref 0.61–1.24)
GFR calc Af Amer: 46 mL/min — ABNORMAL LOW (ref >60)
GFR calc non Af Amer: 40 mL/min — ABNORMAL LOW (ref >60)
GLUCOSE: 192 mg/dL — AB (ref 65–99)
POTASSIUM: 3.1 mmol/L — AB (ref 3.5–5.1)
SODIUM: 134 mmol/L — AB (ref 135–145)
Total Bilirubin: 0.9 mg/dL (ref 0.3–1.2)
Total Protein: 6.7 g/dL (ref 6.5–8.1)

## 2015-08-26 LAB — GLUCOSE, CAPILLARY
GLUCOSE-CAPILLARY: 171 mg/dL — AB (ref 65–99)
GLUCOSE-CAPILLARY: 215 mg/dL — AB (ref 65–99)
Glucose-Capillary: 242 mg/dL — ABNORMAL HIGH (ref 65–99)
Glucose-Capillary: 294 mg/dL — ABNORMAL HIGH (ref 65–99)

## 2015-08-26 LAB — CBC WITH DIFFERENTIAL/PLATELET
BASOS PCT: 0 % (ref 0–1)
Basophils Absolute: 0 10*3/uL (ref 0.0–0.1)
EOS ABS: 0.1 10*3/uL (ref 0.0–0.7)
EOS PCT: 1 % (ref 0–5)
HEMATOCRIT: 41.7 % (ref 39.0–52.0)
HEMOGLOBIN: 14.4 g/dL (ref 13.0–17.0)
LYMPHS PCT: 22 % (ref 12–46)
Lymphs Abs: 2.8 10*3/uL (ref 0.7–4.0)
MCH: 30.6 pg (ref 26.0–34.0)
MCHC: 34.5 g/dL (ref 30.0–36.0)
MCV: 88.7 fL (ref 78.0–100.0)
MONOS PCT: 7 % (ref 3–12)
Monocytes Absolute: 0.9 10*3/uL (ref 0.1–1.0)
NEUTROS PCT: 70 % (ref 43–77)
Neutro Abs: 8.8 10*3/uL — ABNORMAL HIGH (ref 1.7–7.7)
Platelets: 231 10*3/uL (ref 150–400)
RBC: 4.7 MIL/uL (ref 4.22–5.81)
RDW: 13.3 % (ref 11.5–15.5)
WBC: 12.6 10*3/uL — ABNORMAL HIGH (ref 4.0–10.5)

## 2015-08-26 LAB — TROPONIN I: TROPONIN I: 0.03 ng/mL (ref <0.031)

## 2015-08-26 LAB — BRAIN NATRIURETIC PEPTIDE: B Natriuretic Peptide: 8.9 pg/mL (ref 0.0–100.0)

## 2015-08-26 LAB — MAGNESIUM: Magnesium: 1.9 mg/dL (ref 1.7–2.4)

## 2015-08-26 LAB — TSH: TSH: 1.993 u[IU]/mL (ref 0.350–4.500)

## 2015-08-26 MED ORDER — POLYETHYLENE GLYCOL 3350 17 G PO PACK: 17.0000 g | PACK | Freq: Every day | ORAL | Status: DC | PRN

## 2015-08-26 MED ORDER — MORPHINE SULFATE (PF) 2 MG/ML IV SOLN: 2.0000 mg | INTRAVENOUS | Status: DC | PRN

## 2015-08-26 MED ORDER — ASPIRIN EC 81 MG PO TBEC
81.0000 mg | DELAYED_RELEASE_TABLET | Freq: Every day | ORAL | Status: DC
  Administered 2015-08-26: 81 mg via ORAL
  Filled 2015-08-26 (×2): qty 1

## 2015-08-26 MED ORDER — INSULIN ASPART 100 UNIT/ML ~~LOC~~ SOLN: 0.0000 [IU] | Freq: Every day | SUBCUTANEOUS | Status: DC

## 2015-08-26 MED ORDER — ATORVASTATIN CALCIUM 80 MG PO TABS: 80.0000 mg | ORAL_TABLET | Freq: Every day | ORAL | Status: DC

## 2015-08-26 MED ORDER — POTASSIUM CHLORIDE CRYS ER 20 MEQ PO TBCR
40.0000 meq | EXTENDED_RELEASE_TABLET | Freq: Two times a day (BID) | ORAL | Status: DC
  Administered 2015-08-26: 40 meq via ORAL
  Filled 2015-08-26: qty 2

## 2015-08-26 MED ORDER — SODIUM CHLORIDE 0.9 % IV SOLN
INTRAVENOUS | Status: DC
  Administered 2015-08-26: 01:00:00 via INTRAVENOUS

## 2015-08-26 MED ORDER — HYDROCODONE-ACETAMINOPHEN 5-325 MG PO TABS
1.0000 | ORAL_TABLET | Freq: Four times a day (QID) | ORAL | Status: DC | PRN
  Administered 2015-08-26: 2 via ORAL
  Filled 2015-08-26: qty 2

## 2015-08-26 MED ORDER — TICAGRELOR 90 MG PO TABS
90.0000 mg | ORAL_TABLET | Freq: Two times a day (BID) | ORAL | Status: DC
  Administered 2015-08-26 (×2): 90 mg via ORAL
  Filled 2015-08-26 (×2): qty 1

## 2015-08-26 MED ORDER — NITROGLYCERIN 0.4 MG SL SUBL: 0.4000 mg | SUBLINGUAL_TABLET | SUBLINGUAL | Status: DC | PRN

## 2015-08-26 MED ORDER — HEPARIN SODIUM (PORCINE) 5000 UNIT/ML IJ SOLN
5000.0000 [IU] | Freq: Three times a day (TID) | INTRAMUSCULAR | Status: DC
  Administered 2015-08-26: 5000 [IU] via SUBCUTANEOUS
  Filled 2015-08-26: qty 1

## 2015-08-26 MED ORDER — TOPIRAMATE 25 MG PO TABS
50.0000 mg | ORAL_TABLET | Freq: Two times a day (BID) | ORAL | Status: DC
  Administered 2015-08-26: 50 mg via ORAL
  Filled 2015-08-26 (×2): qty 2

## 2015-08-26 MED ORDER — ONDANSETRON HCL 4 MG/2ML IJ SOLN: 4.0000 mg | Freq: Four times a day (QID) | INTRAMUSCULAR | Status: DC | PRN

## 2015-08-26 MED ORDER — PANTOPRAZOLE SODIUM 40 MG IV SOLR
40.0000 mg | Freq: Two times a day (BID) | INTRAVENOUS | Status: DC
  Administered 2015-08-26 (×2): 40 mg via INTRAVENOUS
  Filled 2015-08-26 (×2): qty 40

## 2015-08-26 MED ORDER — INSULIN GLARGINE 100 UNIT/ML ~~LOC~~ SOLN
50.0000 [IU] | Freq: Two times a day (BID) | SUBCUTANEOUS | Status: DC
  Administered 2015-08-26: 50 [IU] via SUBCUTANEOUS
  Filled 2015-08-26 (×2): qty 0.5

## 2015-08-26 MED ORDER — GABAPENTIN 300 MG PO CAPS: 300.0000 mg | ORAL_CAPSULE | Freq: Three times a day (TID) | ORAL | Status: AC

## 2015-08-26 MED ORDER — SODIUM CHLORIDE 0.9 % IJ SOLN
3.0000 mL | Freq: Two times a day (BID) | INTRAMUSCULAR | Status: DC
  Administered 2015-08-26 (×2): 3 mL via INTRAVENOUS

## 2015-08-26 MED ORDER — ONDANSETRON HCL 4 MG PO TABS: 4.0000 mg | ORAL_TABLET | Freq: Four times a day (QID) | ORAL | Status: DC | PRN

## 2015-08-26 MED ORDER — INSULIN ASPART 100 UNIT/ML ~~LOC~~ SOLN
0.0000 [IU] | Freq: Three times a day (TID) | SUBCUTANEOUS | Status: DC
  Administered 2015-08-26: 8 [IU] via SUBCUTANEOUS
  Administered 2015-08-26: 5 [IU] via SUBCUTANEOUS

## 2015-08-26 NOTE — Discharge Summary (Addendum)
Physician Discharge Summary  Patient ID: [name] MRN: [id number] DOB/AGE: 57/20/1957 57 y.o.  Admit date: 08/25/2015 Discharge date: 08/26/2015  Primary Discharge Diagnosis 1. Nausea and Vomiting probably diabetic gastroparesis and medication induced 2. Hypoglycemia 3. DM with peripheral neuropathy  Secondary Discharge Diagnosis 1.H/O Acute non-ST elevation myocardial infarction involving anterolateral wall on 08/21/2015. S/P PTCA and stenting of D1 branch of LAD with implantation of a 2.5 x 24 mm Synergy DES. LVEF preserved at 50-55% with mid anterolateral hypokinesis. 2. 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 Diabetes mellitus type 2 uncontrolled with peripheral neuropathy and stage 3 CKD 4. Hyperlipidemia 5. Hypertension 6. Moderate obesity  Significant Diagnostic Studies:  Hospital Course: This is a 59 y.o. 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 Patient was in his usual state of health when he developed these symptoms. He had a PCI for NSTEMI performed on 08/21/2015 and was discharged on 08/22/2015 after PCI to the D1 branch of the LAD.  Patient has been having some nausea since yesterday and this got worse over the last 24 hours. He has associated decreased urination, dizziness, low blood glucose levels, chest pain, neck pain, shoulder pain and leg pain.  Patient states that he has made significant lifestyle changes and has been eating less and has been having episodes of low blood sugar.  He was also complaining of severe pain in his legs.  He had called his PCP who advised him that is probably related to his diabetes however patient presented to the emergency room due  to severe nausea.  Since hospital admission his symptoms are completely resolved.  He has not had any chest pain, shortness of breath.  He appears to be motivated in making lifestyle changes.  Family present at the bedside.  He is doing well, renal function stable, cardiac markers negative, hence felt stable for discharge.   Recommendations on discharge: I will start the patient on Neurontin 300 mg by mouth 3 times a day to see if his symptoms of severe leg and would improve which is suspect is due to severe diabetic peripheral neuropathy.  From cardiac standpoint his symptoms are completely atypical, he has not had chest pain that is similar to his angina when he initially presented, hence patient was reassured. He will discuss further with his PCP regarding DM management and also I will f/u his renal function as OP. No significant change in eGFR.  Discharge Exam: Blood pressure 122/53, pulse 56, temperature 97.9 F (36.6 C), temperature source Oral, resp. rate 18, height 6' 4"  (1.93 m), weight 136.85 kg (301 lb 11.2 oz), SpO2 96 %. Body mass index is 36.74 kg/(m^2).  General appearance: alert, cooperative, no distress and moderately obese Eyes: negative findings: lids and lashes normal and Wears glasses Neck: no carotid bruit, no JVD, supple, symmetrical, trachea midline and thyroid not enlarged, symmetric, no tenderness/mass/nodules Neck: JVP - normal, carotids 2+= without bruits Resp: clear to auscultation bilaterally Chest wall: no tenderness Cardio: regular rate and rhythm, S1, S2 normal, no murmur, click, rub or gallop GI: soft, non-tender; bowel sounds normal; no masses, no organomegaly and obese Extremities: 1-2+ below-knee edema, on the right and trace on the left, pitting. Full range of movements. Pulses: 2+ and symmetric, except dorsalis pedis  is 1+ bilateral. Skin: Skin color, texture, turgor normal. No rashes or lesions Neurologic: Grossly normal   Labs:   Lab Results   Component Value Date   WBC 12.6* 08/26/2015   HGB 14.4 08/26/2015   HCT 41.7 08/26/2015   MCV 88.7 08/26/2015   PLT 231 08/26/2015    Recent Labs Lab 08/26/15 0117  NA 134*  K 3.1*  CL 96*  CO2 28  BUN 27*  CREATININE 1.81*  CALCIUM 8.9  PROT 6.7  BILITOT 0.9  ALKPHOS 46  ALT 48  AST 31  GLUCOSE 192*    Lipid Panel     Component Value Date/Time   CHOL 202* 08/21/2015 1956   TRIG 220* 08/21/2015 1956   HDL 40* 08/21/2015 1956   CHOLHDL 5.1 08/21/2015 1956   VLDL 44* 08/21/2015 1956   LDLCALC 118* 08/21/2015 1956    BNP (last 3 results)  Recent Labs  08/26/15 0117  BNP 8.9    HEMOGLOBIN A1C Lab Results  Component Value Date   HGBA1C 8.4* 08/21/2015   MPG 194 08/21/2015    Cardiac Panel (last 3 results)  Recent Labs  08/21/15 1950 08/22/15 0114 08/22/15 0816 08/26/15 1019  CKTOTAL 301  --   --   --   CKMB 7.5*  --   --   --   TROPONINI <0.03 0.42* 0.31* 0.03  RELINDX 2.5  --   --   --     Lab Results  Component Value Date   CKTOTAL 301 08/21/2015   CKMB 7.5* 08/21/2015   TROPONINI 0.03 08/26/2015     TSH  Recent Labs  08/22/15 0114 08/26/15 0117  TSH 0.564 1.993    EKG: normal EKG, normal sinus rhythm, unchanged from previous tracings.   Radiology: Dg Chest 2 View  08/25/2015   CLINICAL DATA:  Chest pain and shortness of breath today.  EXAM: CHEST  2 VIEW  COMPARISON:  04/26/2014 and prior radiographs  FINDINGS: Mild cardiomegaly again noted.  There is no evidence of focal airspace disease, pulmonary edema, suspicious pulmonary nodule/mass, pleural effusion, or pneumothorax. No acute bony abnormalities are identified.  IMPRESSION: Mild cardiomegaly without evidence of acute cardiopulmonary disease.   Electronically Signed   By: Margarette Canada M.D.   On: 08/25/2015 22:55      FOLLOW UP PLANS AND APPOINTMENTS Has appointment with me on 09/06/15.    Medication List    STOP taking these medications        naproxen 500 MG tablet   Commonly known as:  NAPROSYN      TAKE these medications        aspirin 81 MG tablet  Take 81 mg by mouth daily.     atorvastatin 80 MG tablet  Commonly known as:  LIPITOR  Take 1 tablet (80 mg total) by mouth daily at 6 PM.     carvedilol 6.25 MG tablet  Commonly known as:  COREG  Take 1 tablet (6.25 mg total) by mouth 2 (two) times daily with a meal.     furosemide 40 MG tablet  Commonly known as:  LASIX  Take 60 mg by mouth 2 (two) times daily.     gabapentin 300 MG capsule  Commonly known as:  NEURONTIN  Take 1 capsule (300 mg total) by mouth 3 (three) times daily.     insulin aspart 100 UNIT/ML injection  Commonly known as:  novoLOG  Inject 40 Units into the skin 2 (two) times daily.  insulin glargine 100 UNIT/ML injection  Commonly known as:  LANTUS  Inject 50-75 Units into the skin at bedtime. Takes 75 units in evenings.   If FSBS drops below 120, then reduce dosage to 50 units daily.   IF FSBS is above 130, then increase 1 unit every other day until FSBS is within range.     losartan-hydrochlorothiazide 50-12.5 MG per tablet  Commonly known as:  HYZAAR  Take 1 tablet by mouth daily.     metFORMIN 500 MG tablet  Commonly known as:  GLUCOPHAGE  Take 4 tablets (2,000 mg total) by mouth daily.     nitroGLYCERIN 0.4 MG SL tablet  Commonly known as:  NITROSTAT  Place 1 tablet (0.4 mg total) under the tongue every 5 (five) minutes as needed for chest pain.     omeprazole 40 MG capsule  Commonly known as:  PRILOSEC  Take 40 mg by mouth daily.     potassium chloride SA 20 MEQ tablet  Commonly known as:  K-DUR,KLOR-CON  Take 20 mEq by mouth 3 (three) times daily.     sulfamethoxazole-trimethoprim 800-160 MG per tablet  Commonly known as:  BACTRIM DS,SEPTRA DS  Take 1 tablet by mouth 2 (two) times daily. Filled on 08-10-15 for 14 days     ticagrelor 90 MG Tabs tablet  Commonly known as:  BRILINTA  Take 1 tablet (90 mg total) by mouth 2 (two) times daily.      TOPAMAX 25 MG tablet  Generic drug:  topiramate  Take 50 mg by mouth 2 (two) times daily.          Adrian Prows, MD 08/26/2015, 12:14 PM  Pager: [phone] Office: [phone] If no answer: [phone]

## 2015-08-26 NOTE — Discharge Instructions (Signed)
Gabapentin capsules or tablets What is this medicine? GABAPENTIN (GA ba pen tin) is used to control partial seizures in adults with epilepsy. It is also used to treat certain types of nerve pain. This medicine may be used for other purposes; ask your health care provider or pharmacist if you have questions. COMMON BRAND NAME(S): Gabarone, Neurontin What should I tell my health care provider before I take this medicine? They need to know if you have any of these conditions: -kidney disease -suicidal thoughts, plans, or attempt; a previous suicide attempt by you or a family member -an unusual or allergic reaction to gabapentin, other medicines, foods, dyes, or preservatives -pregnant or trying to get pregnant -breast-feeding How should I use this medicine? Take this medicine by mouth with a glass of water. Follow the directions on the prescription label. You can take it with or without food. If it upsets your stomach, take it with food.Take your medicine at regular intervals. Do not take it more often than directed. Do not stop taking except on your doctor's advice. If you are directed to break the 600 or 800 mg tablets in half as part of your dose, the extra half tablet should be used for the next dose. If you have not used the extra half tablet within 28 days, it should be thrown away. A special MedGuide will be given to you by the pharmacist with each prescription and refill. Be sure to read this information carefully each time. Talk to your pediatrician regarding the use of this medicine in children. Special care may be needed. Overdosage: If you think you have taken too much of this medicine contact a poison control center or emergency room at once. NOTE: This medicine is only for you. Do not share this medicine with others. What if I miss a dose? If you miss a dose, take it as soon as you can. If it is almost time for your next dose, take only that dose. Do not take double or extra  doses. What may interact with this medicine? Do not take this medicine with any of the following medications: -other gabapentin products This medicine may also interact with the following medications: -alcohol -antacids -antihistamines for allergy, cough and cold -certain medicines for anxiety or sleep -certain medicines for depression or psychotic disturbances -homatropine; hydrocodone -naproxen -narcotic medicines (opiates) for pain -phenothiazines like chlorpromazine, mesoridazine, prochlorperazine, thioridazine This list may not describe all possible interactions. Give your health care provider a list of all the medicines, herbs, non-prescription drugs, or dietary supplements you use. Also tell them if you smoke, drink alcohol, or use illegal drugs. Some items may interact with your medicine. What should I watch for while using this medicine? Visit your doctor or health care professional for regular checks on your progress. You may want to keep a record at home of how you feel your condition is responding to treatment. You may want to share this information with your doctor or health care professional at each visit. You should contact your doctor or health care professional if your seizures get worse or if you have any new types of seizures. Do not stop taking this medicine or any of your seizure medicines unless instructed by your doctor or health care professional. Stopping your medicine suddenly can increase your seizures or their severity. Wear a medical identification bracelet or chain if you are taking this medicine for seizures, and carry a card that lists all your medications. You may get drowsy, dizzy, or have blurred   vision. Do not drive, use machinery, or do anything that needs mental alertness until you know how this medicine affects you. To reduce dizzy or fainting spells, do not sit or stand up quickly, especially if you are an older patient. Alcohol can increase drowsiness and  dizziness. Avoid alcoholic drinks. Your mouth may get dry. Chewing sugarless gum or sucking hard candy, and drinking plenty of water will help. The use of this medicine may increase the chance of suicidal thoughts or actions. Pay special attention to how you are responding while on this medicine. Any worsening of mood, or thoughts of suicide or dying should be reported to your health care professional right away. Women who become pregnant while using this medicine may enroll in the [place] Antiepileptic Drug Pregnancy Registry by calling 1-888-233-2334. This registry collects information about the safety of antiepileptic drug use during pregnancy. What side effects may I notice from receiving this medicine? Side effects that you should report to your doctor or health care professional as soon as possible: -allergic reactions like skin rash, itching or hives, swelling of the face, lips, or tongue -worsening of mood, thoughts or actions of suicide or dying Side effects that usually do not require medical attention (report to your doctor or health care professional if they continue or are bothersome): -constipation -difficulty walking or controlling muscle movements -dizziness -nausea -slurred speech -tiredness -tremors -weight gain This list may not describe all possible side effects. Call your doctor for medical advice about side effects. You may report side effects to FDA at 1-800-FDA-1088. Where should I keep my medicine? Keep out of reach of children. Store at room temperature between 15 and 30 degrees C (59 and 86 degrees F). Throw away any unused medicine after the expiration date. NOTE: This sheet is a summary. It may not cover all possible information. If you have questions about this medicine, talk to your doctor, pharmacist, or health care provider.  2015, [employer]. (2013-08-12 09:12:48)  

## 2015-08-29 LAB — HEMOGLOBIN A1C
Hgb A1c MFr Bld: 8.2 % — ABNORMAL HIGH (ref 4.8–5.6)
Mean Plasma Glucose: 189 mg/dL

## 2015-10-16 ENCOUNTER — Ambulatory Visit: Payer: Self-pay | Admitting: Dietician

## 2019-08-11 ENCOUNTER — Ambulatory Visit: Payer: Self-pay | Admitting: Cardiology

## 2019-08-23 ENCOUNTER — Other Ambulatory Visit: Payer: Self-pay | Admitting: Cardiology

## 2019-09-15 ENCOUNTER — Other Ambulatory Visit: Payer: Self-pay | Admitting: Cardiology

## 2019-09-22 ENCOUNTER — Encounter: Payer: Self-pay | Admitting: Cardiology

## 2019-09-22 ENCOUNTER — Other Ambulatory Visit: Payer: Self-pay

## 2019-09-22 ENCOUNTER — Ambulatory Visit (INDEPENDENT_AMBULATORY_CARE_PROVIDER_SITE_OTHER): Payer: Self-pay | Admitting: Cardiology

## 2019-09-22 VITALS — BP 140/76 | HR 56 | Temp 97.7°F | Ht 76.0 in | Wt 311.0 lb

## 2019-09-22 DIAGNOSIS — Z006 Encounter for examination for normal comparison and control in clinical research program: Secondary | ICD-10-CM

## 2019-09-22 DIAGNOSIS — I10 Essential (primary) hypertension: Secondary | ICD-10-CM

## 2019-09-22 DIAGNOSIS — E7800 Pure hypercholesterolemia, unspecified: Secondary | ICD-10-CM

## 2019-09-22 DIAGNOSIS — Z720 Tobacco use: Secondary | ICD-10-CM

## 2019-09-22 DIAGNOSIS — Z789 Other specified health status: Secondary | ICD-10-CM

## 2019-09-22 DIAGNOSIS — I2510 Atherosclerotic heart disease of native coronary artery without angina pectoris: Secondary | ICD-10-CM

## 2019-09-22 MED ORDER — AMLODIPINE BESYLATE 5 MG PO TABS: 5.0000 mg | ORAL_TABLET | Freq: Every day | ORAL | 2 refills | Status: DC
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
## 2019-11-16 DIAGNOSIS — C4359 Malignant melanoma of other part of trunk: Secondary | ICD-10-CM

## 2019-11-16 HISTORY — DX: Malignant melanoma of other part of trunk: C43.59

## 2019-11-22 ENCOUNTER — Other Ambulatory Visit: Payer: Self-pay

## 2019-11-22 ENCOUNTER — Encounter: Payer: Self-pay | Admitting: Cardiology

## 2019-11-22 ENCOUNTER — Ambulatory Visit (INDEPENDENT_AMBULATORY_CARE_PROVIDER_SITE_OTHER): Payer: Self-pay | Admitting: Cardiology

## 2019-11-22 VITALS — BP 141/76 | HR 56 | Temp 97.6°F | Ht 76.0 in | Wt 313.8 lb

## 2019-11-22 DIAGNOSIS — Z720 Tobacco use: Secondary | ICD-10-CM

## 2019-11-22 DIAGNOSIS — I2510 Atherosclerotic heart disease of native coronary artery without angina pectoris: Secondary | ICD-10-CM

## 2019-11-22 DIAGNOSIS — Z006 Encounter for examination for normal comparison and control in clinical research program: Secondary | ICD-10-CM

## 2019-11-22 DIAGNOSIS — I10 Essential (primary) hypertension: Secondary | ICD-10-CM

## 2019-11-22 MED ORDER — CARVEDILOL 12.5 MG PO TABS: 12.5000 mg | ORAL_TABLET | Freq: Two times a day (BID) | ORAL | 3 refills | Status: DC

## 2019-11-22 MED ORDER — AMLODIPINE BESYLATE 5 MG PO TABS: 5.0000 mg | ORAL_TABLET | Freq: Every day | ORAL | 3 refills | Status: DC

## 2019-11-22 MED ORDER — LOSARTAN POTASSIUM-HCTZ 100-25 MG PO TABS
1.0000 | ORAL_TABLET | Freq: Every morning | ORAL | 3 refills | Status: DC
Start: 2019-11-22 — End: 2021-02-06
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
## 2019-11-24 ENCOUNTER — Telehealth: Payer: Self-pay

## 2019-11-24 NOTE — Telephone Encounter (Signed)
Pt called asking about his amlodipine informed pt he is still 5 mg pt mention he thought he was going to take 2 tab to add up 10mg .
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
## 2020-05-23 DIAGNOSIS — B999 Unspecified infectious disease: Secondary | ICD-10-CM

## 2020-05-23 HISTORY — DX: Unspecified infectious disease: B99.9

## 2020-05-24 ENCOUNTER — Other Ambulatory Visit: Payer: Self-pay

## 2020-05-24 ENCOUNTER — Encounter: Payer: Self-pay | Admitting: Cardiology

## 2020-05-24 ENCOUNTER — Ambulatory Visit: Payer: Self-pay | Admitting: Cardiology

## 2020-05-24 VITALS — BP 152/86 | HR 56 | Resp 15 | Ht 76.0 in | Wt 315.0 lb

## 2020-05-24 DIAGNOSIS — Z789 Other specified health status: Secondary | ICD-10-CM

## 2020-05-24 DIAGNOSIS — I739 Peripheral vascular disease, unspecified: Secondary | ICD-10-CM

## 2020-05-24 DIAGNOSIS — I2510 Atherosclerotic heart disease of native coronary artery without angina pectoris: Secondary | ICD-10-CM

## 2020-05-24 DIAGNOSIS — Z720 Tobacco use: Secondary | ICD-10-CM

## 2020-05-24 DIAGNOSIS — L98491 Non-pressure chronic ulcer of skin of other sites limited to breakdown of skin: Secondary | ICD-10-CM

## 2020-05-24 DIAGNOSIS — Z006 Encounter for examination for normal comparison and control in clinical research program: Secondary | ICD-10-CM

## 2020-05-24 DIAGNOSIS — I10 Essential (primary) hypertension: Secondary | ICD-10-CM

## 2020-05-24 MED ORDER — CLOPIDOGREL BISULFATE 75 MG PO TABS: 75.0000 mg | ORAL_TABLET | Freq: Every day | ORAL | 1 refills | Status: DC

## 2020-05-30 ENCOUNTER — Ambulatory Visit: Payer: Self-pay

## 2020-05-30 ENCOUNTER — Other Ambulatory Visit: Payer: Self-pay

## 2020-05-30 DIAGNOSIS — I739 Peripheral vascular disease, unspecified: Secondary | ICD-10-CM

## 2020-05-30 DIAGNOSIS — L98491 Non-pressure chronic ulcer of skin of other sites limited to breakdown of skin: Secondary | ICD-10-CM

## 2020-06-12 DIAGNOSIS — Z87898 Personal history of other specified conditions: Secondary | ICD-10-CM | POA: Insufficient documentation

## 2020-06-12 DIAGNOSIS — L03115 Cellulitis of right lower limb: Secondary | ICD-10-CM | POA: Insufficient documentation

## 2020-06-12 DIAGNOSIS — Z720 Tobacco use: Secondary | ICD-10-CM | POA: Insufficient documentation

## 2020-06-12 DIAGNOSIS — K769 Liver disease, unspecified: Secondary | ICD-10-CM | POA: Insufficient documentation

## 2020-06-12 DIAGNOSIS — R339 Retention of urine, unspecified: Secondary | ICD-10-CM | POA: Insufficient documentation

## 2020-06-23 ENCOUNTER — Encounter: Payer: Self-pay | Admitting: Sports Medicine

## 2020-06-23 ENCOUNTER — Other Ambulatory Visit: Payer: Self-pay | Admitting: Sports Medicine

## 2020-06-23 ENCOUNTER — Ambulatory Visit: Payer: Self-pay | Admitting: Cardiology

## 2020-06-23 ENCOUNTER — Other Ambulatory Visit: Payer: Self-pay

## 2020-06-23 ENCOUNTER — Encounter: Payer: Self-pay | Admitting: Cardiology

## 2020-06-23 ENCOUNTER — Ambulatory Visit (INDEPENDENT_AMBULATORY_CARE_PROVIDER_SITE_OTHER): Payer: Self-pay | Admitting: Sports Medicine

## 2020-06-23 VITALS — BP 108/57 | HR 61 | Resp 15 | Ht 76.0 in | Wt 314.0 lb

## 2020-06-23 DIAGNOSIS — I739 Peripheral vascular disease, unspecified: Secondary | ICD-10-CM

## 2020-06-23 DIAGNOSIS — L03115 Cellulitis of right lower limb: Secondary | ICD-10-CM

## 2020-06-23 DIAGNOSIS — M79671 Pain in right foot: Secondary | ICD-10-CM

## 2020-06-23 DIAGNOSIS — Z006 Encounter for examination for normal comparison and control in clinical research program: Secondary | ICD-10-CM

## 2020-06-23 DIAGNOSIS — E0842 Diabetes mellitus due to underlying condition with diabetic polyneuropathy: Secondary | ICD-10-CM

## 2020-06-23 DIAGNOSIS — L84 Corns and callosities: Secondary | ICD-10-CM

## 2020-06-23 DIAGNOSIS — I2510 Atherosclerotic heart disease of native coronary artery without angina pectoris: Secondary | ICD-10-CM

## 2020-06-23 DIAGNOSIS — M79672 Pain in left foot: Secondary | ICD-10-CM

## 2020-06-23 DIAGNOSIS — L97511 Non-pressure chronic ulcer of other part of right foot limited to breakdown of skin: Secondary | ICD-10-CM
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
## 2020-06-23 NOTE — Progress Notes (Signed)
Subjective: [name] is a 64 y.o. male patient with history of diabetes who returns to office today for follow-up relation of cellulitis to right lower extremity and toe ulcerations.  Patient reports that the toe wounds have been present been dry and no longer open to oozing or drainage patient also reports that he has a little bit of soreness on the lateral side of the right foot for about a week reports that slowly the swelling in his legs have started since April and then the beginning of June he got worse reports that he was admitted to [facility] where he was given 3 different antibiotics in the left early because they kept giving him pain medication and he did not want it reports that since his discharge he has been on Bactrim and the cellulitis of the leg seems to be improving.  Patient reports that his blood sugar on yesterday was 120 last A1c 9.1 and last visit to PCP was on yesterday.  No other pedal complaints noted.  Patient Active Problem List   Diagnosis Date Noted  . Cellulitis of right lower extremity 06/12/2020  . Disorder of liver 06/12/2020  . 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  . Tobacco use 06/12/2020  . Urinary retention 06/12/2020  . Prostatitis 08/26/2015  . Nausea 08/25/2015  . Dehydration 08/25/2015  . Uncontrolled diabetes mellitus ([place]) 08/25/2015  . Diabetic neuropathy ([place]) 08/25/2015  . Diabetic nephropathy ([place]) 08/25/2015  . Chest pain at rest 08/25/2015  . NSTEMI (non-ST elevated myocardial infarction) ([place]) 08/21/2015  . CAD (coronary artery disease), native coronary artery 08/21/2015  . 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 HTN (hypertension), benign 07/27/2012  . MALIGNANT MELANOMA SKIN TRUNK EXCEPT SCROTUM 01/30/2010  . NEOPLASM, MALIGNANT, COLORECTAL, CARCINOID 01/25/2010  . DIAB W/NEURO MANIFESTS TYPE II/UNS TYPE UNCNTRL 01/25/2010  . NAUSEA AND VOMITING 01/25/2010  . ABDOMINAL PAIN,  GENERALIZED 01/25/2010   Current Outpatient Medications on File Prior to Visit  Medication Sig Dispense Refill  . amLODipine (NORVASC) 5 MG tablet Take 1 tablet (5 mg total) by mouth daily. 90 tablet 3  . aspirin 81 MG tablet Take 81 mg by mouth daily.     . carvedilol (COREG) 12.5 MG tablet Take 1 tablet (12.5 mg total) by mouth 2 (two) times daily with a meal. 180 tablet 3  . clopidogrel (PLAVIX) 75 MG tablet Take 1 tablet (75 mg total) by mouth daily. 90 tablet 1  . gabapentin (NEURONTIN) 300 MG capsule Take 1 capsule (300 mg total) by mouth 3 (three) times daily. (Patient taking differently: Take 300 mg by mouth 4 (four) times daily. ) 90 capsule 1  . insulin aspart (NOVOLOG) 100 UNIT/ML injection Inject 20 Units into the skin 2 (two) times daily.     . insulin glargine (LANTUS) 100 UNIT/ML injection Inject 50-75 Units into the skin at bedtime. Takes 62  In the mornings. Takes 58 units in evenings.    Marland Kitchen losartan-hydrochlorothiazide (HYZAAR) 100-25 MG tablet Take 1 tablet by mouth every morning. 90 tablet 3  . metFORMIN (GLUCOPHAGE) 500 MG tablet Take 4 tablets (2,000 mg total) by mouth daily.    . Multiple Vitamin (MULTIVITAMIN) capsule Take 1 capsule by mouth daily.    . nitroGLYCERIN (NITROSTAT) 0.4 MG SL tablet Place 1 tablet (0.4 mg total) under the tongue every 5 (five) minutes as needed for chest pain. 25 tablet 2  . promethazine (PHENERGAN) 25 MG tablet Take by mouth.     No  current facility-administered medications on file prior to visit.   Allergies  Allergen Reactions  . Avelox [Moxifloxacin] Anaphylaxis, Hives, Swelling and Other (See Comments)  . Bee Venom Anaphylaxis, Hives, Swelling and Other (See Comments)  . Ciprofloxacin Anaphylaxis, Hives, Swelling and Other (See Comments)  . Iodinated Diagnostic Agents Hives, Rash and Shortness Of Breath  . Iohexol Anaphylaxis, Hives, Swelling and Other (See Comments)     Code: HIVES, Dyspnea and anaphylaxis.  Desc: Do not give IV  contrast; pt premedicated with radiologists prep; hives occurred; pt had prev reaction w/ md prep; prev reaction hives; dyspnea; throat swelling, Onset Date: [id number]     No results found for this or any previous visit (from the past 2160 hour(s)).  Objective: General: Patient is awake, alert, and oriented x 3 and in no acute distress.  Integument: Skin is warm, dry and supple bilateral.  Preulcerative lesions noted bilateral second toes with dry heme no opening decreased erythema decreased swelling no warmth to toes.  There is also a preulcerative lesion noted to the fifth metatarsal base on the lateral side of the right foot with no signs of infection.  Nails x10 are short with mild lifting of the right third toenail without any signs of infection.  Vasculature:  Dorsalis Pedis pulse `/4 bilateral. Posterior Tibial pulse  0/4 bilateral. Capillary fill time <5 sec 1-5 bilateral. Positive hair growth to the level of the digits.Temperature gradient within normal limits no increase in temperature over the right anterior shin area resolving cellulitis.  1+ pitting edema noted bilateral right greater than left.  Neurology: The patient has absent sensation measured with a 5.07/10g Semmes Weinstein Monofilament at all pedal sites bilateral . Vibratory sensation absent bilateral with tuning fork. No Babinski sign present bilateral.   Musculoskeletal: Long second toes bilateral.  No tenderness with calf compression bilateral.  Assessment and Plan: Problem List Items Addressed This Visit      Endocrine   Diabetic neuropathy ([place])     Other   Cellulitis of right lower extremity - Primary    Other Visit Diagnoses    Pre-ulcerative calluses       Right foot pain       PVD (peripheral vascular disease) ([place])          -Examined patient. -Discussed and educated patient on diabetic foot care and care for preulcerative lesions bilateral and resolving cellulitis -Advised patient to continue with  Bactrim until completed may call for refill if areas worsen -Advised patient to closely monitor her toes refrain from picking and allow the preulcerative lesions to slowly dry and peel off on their at no additional charge using a sterile tissue nipper mechanically debrided toes removing any loose keratotic tissue or loosening nail without incident -Recommend to wear shoes that give his toe space that do not rub the toes -Recommend to apply Band-Aid to the lateral foot to prevent the lateral foot from rubbing -Advised patient to use skin cream at the anterior shin only once daily but not excessively to the cause worsening in symptoms -Return to office in 2 weeks for toe check or sooner if any problems or issues arise.  May consider postoperative shoes if toes continue to fail to improve.  Landis Martins, DPM

## 2020-06-24 ENCOUNTER — Encounter: Payer: Self-pay | Admitting: Cardiology

## 2020-07-07 ENCOUNTER — Ambulatory Visit (INDEPENDENT_AMBULATORY_CARE_PROVIDER_SITE_OTHER): Payer: Self-pay | Admitting: Sports Medicine

## 2020-07-07 ENCOUNTER — Other Ambulatory Visit: Payer: Self-pay

## 2020-07-07 DIAGNOSIS — M79671 Pain in right foot: Secondary | ICD-10-CM

## 2020-07-07 DIAGNOSIS — I739 Peripheral vascular disease, unspecified: Secondary | ICD-10-CM

## 2020-07-07 DIAGNOSIS — L03115 Cellulitis of right lower limb: Secondary | ICD-10-CM

## 2020-07-07 DIAGNOSIS — L84 Corns and callosities: Secondary | ICD-10-CM

## 2020-07-07 DIAGNOSIS — E0842 Diabetes mellitus due to underlying condition with diabetic polyneuropathy: Secondary | ICD-10-CM

## 2020-07-08 ENCOUNTER — Encounter: Payer: Self-pay | Admitting: Sports Medicine

## 2020-07-08 NOTE — Progress Notes (Signed)
Subjective: [name] is a 64 y.o. male patient with history of diabetes who returns to office today for follow-up evaluation of bilateral toe ulcers and cellulitis ports that the cellulitis looks a little better but still experiences redness and swelling reports that he does not see a difference with his toes still has redness and swelling but no drainage has been using Vaseline on his leg and denies nausea vomiting fever or chills.  Patient is diabetic and reports that his blood sugar today was 190.  No other pedal complaints noted.  Patient Active Problem List   Diagnosis Date Noted  . Cellulitis of right lower extremity 06/12/2020  . Disorder of liver 06/12/2020  . 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  . Tobacco use 06/12/2020  . Urinary retention 06/12/2020  . Prostatitis 08/26/2015  . Nausea 08/25/2015  . Dehydration 08/25/2015  . Uncontrolled diabetes mellitus ([place]) 08/25/2015  . Diabetic neuropathy ([place]) 08/25/2015  . Diabetic nephropathy ([place]) 08/25/2015  . Chest pain at rest 08/25/2015  . NSTEMI (non-ST elevated myocardial infarction) ([place]) 08/21/2015  . CAD (coronary artery disease), native coronary artery 08/21/2015  . 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 HTN (hypertension), benign 07/27/2012  . MALIGNANT MELANOMA SKIN TRUNK EXCEPT SCROTUM 01/30/2010  . NEOPLASM, MALIGNANT, COLORECTAL, CARCINOID 01/25/2010  . DIAB W/NEURO MANIFESTS TYPE II/UNS TYPE UNCNTRL 01/25/2010  . NAUSEA AND VOMITING 01/25/2010  . ABDOMINAL PAIN, GENERALIZED 01/25/2010   Current Outpatient Medications on File Prior to Visit  Medication Sig Dispense Refill  . amLODipine (NORVASC) 5 MG tablet Take 1 tablet (5 mg total) by mouth daily. 90 tablet 3  . aspirin 81 MG tablet Take 81 mg by mouth daily.     . carvedilol (COREG) 12.5 MG tablet Take 1 tablet (12.5 mg total) by mouth 2 (two) times daily with a meal. 180 tablet 3  . clopidogrel (PLAVIX) 75 MG  tablet Take 1 tablet (75 mg total) by mouth daily. 90 tablet 1  . gabapentin (NEURONTIN) 300 MG capsule Take 1 capsule (300 mg total) by mouth 3 (three) times daily. (Patient taking differently: Take 300 mg by mouth 4 (four) times daily. ) 90 capsule 1  . insulin aspart (NOVOLOG) 100 UNIT/ML injection Inject 20 Units into the skin 2 (two) times daily.     . insulin glargine (LANTUS) 100 UNIT/ML injection Inject 50-75 Units into the skin at bedtime. Takes 62  In the mornings. Takes 58 units in evenings.    Marland Kitchen losartan-hydrochlorothiazide (HYZAAR) 100-25 MG tablet Take 1 tablet by mouth every morning. 90 tablet 3  . metFORMIN (GLUCOPHAGE) 500 MG tablet Take 4 tablets (2,000 mg total) by mouth daily.    . Multiple Vitamin (MULTIVITAMIN) capsule Take 1 capsule by mouth daily.    . nitroGLYCERIN (NITROSTAT) 0.4 MG SL tablet Place 1 tablet (0.4 mg total) under the tongue every 5 (five) minutes as needed for chest pain. 25 tablet 2  . promethazine (PHENERGAN) 25 MG tablet Take by mouth.    . sulfamethoxazole-trimethoprim (BACTRIM DS) 800-160 MG tablet Take 1 tablet by mouth 2 (two) times daily.    . tamsulosin (FLOMAX) 0.4 MG CAPS capsule Take 0.4 mg by mouth daily.     No current facility-administered medications on file prior to visit.   Allergies  Allergen Reactions  . Avelox [Moxifloxacin] Anaphylaxis, Hives, Swelling and Other (See Comments)  . Bee Venom Anaphylaxis, Hives, Swelling and Other (See Comments)  . Ciprofloxacin Anaphylaxis, Hives, Swelling and Other (See Comments)  .  Iodinated Diagnostic Agents Hives, Rash and Shortness Of Breath  . Iohexol Anaphylaxis, Hives, Swelling and Other (See Comments)     Code: HIVES, Dyspnea and anaphylaxis.  Desc: Do not give IV contrast; pt premedicated with radiologists prep; hives occurred; pt had prev reaction w/ md prep; prev reaction hives; dyspnea; throat swelling, Onset Date: [id number]     No results found for this or any previous visit (from the  past 2160 hour(s)).  Objective: General: Patient is awake, alert, and oriented x 3 and in no acute distress.  Integument: Skin is warm, dry and supple bilateral.  Preulcerative lesions noted bilateral second toes with dry heme no opening resolved erythema no edema to toes.  There is also a preulcerative lesion noted to the fifth metatarsal base on the lateral side of the right foot with no signs of infection.  Nails x10 are short and thickened with dry heme bilateral second toes.  Vasculature:  Dorsalis Pedis pulse 1/4 bilateral. Posterior Tibial pulse  0/4 bilateral. Capillary fill time <5 sec 1-5 bilateral. Positive hair growth to the level of the digits.Temperature gradient within normal limits no increase in temperature over the right anterior shin area resolving cellulitis appears now to be changes consistent with PVD.  1+ pitting edema noted bilateral right greater than left.  Neurology: The patient has absent sensation measured with a 5.07/10g Semmes Weinstein Monofilament at all pedal sites bilateral . Vibratory sensation absent bilateral with tuning fork. No Babinski sign present bilateral.   Musculoskeletal: Long second toes bilateral.  No tenderness with calf compression bilateral.  Assessment and Plan: Problem List Items Addressed This Visit      Endocrine   Diabetic neuropathy ([place])     Other   Cellulitis of right lower extremity - Primary    Other Visit Diagnoses    Pre-ulcerative calluses       Right foot pain       PVD (peripheral vascular disease) ([place])         -Examined patient. -Re-Discussed and educated patient on diabetic foot care and care for preulcerative lesions bilateral and resolving cellulitis in the setting of PVD -Mechanically debrided pre-ulcerative callus at 2nd toes and lateral foot on right using tissue nipper without incident  -Applied offloading padding to right foot -Advised patient to use foot miracle for leg and dry skin areas  -Return to office  in 4 weeks for toe check.   Landis Martins, DPM

## 2020-08-08 ENCOUNTER — Other Ambulatory Visit: Payer: Self-pay

## 2020-08-08 ENCOUNTER — Ambulatory Visit (INDEPENDENT_AMBULATORY_CARE_PROVIDER_SITE_OTHER): Payer: Self-pay | Admitting: Sports Medicine

## 2020-08-08 DIAGNOSIS — L84 Corns and callosities: Secondary | ICD-10-CM

## 2020-08-08 DIAGNOSIS — I739 Peripheral vascular disease, unspecified: Secondary | ICD-10-CM

## 2020-08-08 DIAGNOSIS — M79671 Pain in right foot: Secondary | ICD-10-CM

## 2020-08-08 DIAGNOSIS — E0842 Diabetes mellitus due to underlying condition with diabetic polyneuropathy: Secondary | ICD-10-CM

## 2020-08-08 DIAGNOSIS — L03115 Cellulitis of right lower limb: Secondary | ICD-10-CM

## 2020-08-08 NOTE — Progress Notes (Signed)
Subjective: [name] is a 64 y.o. male patient with history of diabetes who returns to office today for follow-up evaluation of bilateral toe ulcers and cellulitis.  Patient reports that he has dark spot in his left second toenail and that his nail is lifting a little bit and got caught on his compression sleeve but otherwise they look a lot better with less redness and swelling especially on the right reports that he has been using foot miracle cream as instructed.  Patient is diabetic and reports that his blood sugar today was 145.  No other pedal complaints noted.  Patient Active Problem List   Diagnosis Date Noted  . Cellulitis of right lower extremity 06/12/2020  . Disorder of liver 06/12/2020  . 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  . Tobacco use 06/12/2020  . Urinary retention 06/12/2020  . Prostatitis 08/26/2015  . Nausea 08/25/2015  . Dehydration 08/25/2015  . Uncontrolled diabetes mellitus ([place]) 08/25/2015  . Diabetic neuropathy ([place]) 08/25/2015  . Diabetic nephropathy ([place]) 08/25/2015  . Chest pain at rest 08/25/2015  . NSTEMI (non-ST elevated myocardial infarction) ([place]) 08/21/2015  . CAD (coronary artery disease), native coronary artery 08/21/2015  . 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 HTN (hypertension), benign 07/27/2012  . MALIGNANT MELANOMA SKIN TRUNK EXCEPT SCROTUM 01/30/2010  . NEOPLASM, MALIGNANT, COLORECTAL, CARCINOID 01/25/2010  . DIAB W/NEURO MANIFESTS TYPE II/UNS TYPE UNCNTRL 01/25/2010  . NAUSEA AND VOMITING 01/25/2010  . ABDOMINAL PAIN, GENERALIZED 01/25/2010   Current Outpatient Medications on File Prior to Visit  Medication Sig Dispense Refill  . amLODipine (NORVASC) 5 MG tablet Take 1 tablet (5 mg total) by mouth daily. 90 tablet 3  . aspirin 81 MG tablet Take 81 mg by mouth daily.     . carvedilol (COREG) 12.5 MG tablet Take 1 tablet (12.5 mg total) by mouth 2 (two) times daily with a meal. 180 tablet 3   . clopidogrel (PLAVIX) 75 MG tablet Take 1 tablet (75 mg total) by mouth daily. 90 tablet 1  . gabapentin (NEURONTIN) 300 MG capsule Take 1 capsule (300 mg total) by mouth 3 (three) times daily. (Patient taking differently: Take 300 mg by mouth 4 (four) times daily. ) 90 capsule 1  . insulin aspart (NOVOLOG) 100 UNIT/ML injection Inject 20 Units into the skin 2 (two) times daily.     . insulin glargine (LANTUS) 100 UNIT/ML injection Inject 50-75 Units into the skin at bedtime. Takes 62  In the mornings. Takes 58 units in evenings.    Marland Kitchen losartan-hydrochlorothiazide (HYZAAR) 100-25 MG tablet Take 1 tablet by mouth every morning. 90 tablet 3  . metFORMIN (GLUCOPHAGE) 500 MG tablet Take 4 tablets (2,000 mg total) by mouth daily.    . Multiple Vitamin (MULTIVITAMIN) capsule Take 1 capsule by mouth daily.    . nitroGLYCERIN (NITROSTAT) 0.4 MG SL tablet Place 1 tablet (0.4 mg total) under the tongue every 5 (five) minutes as needed for chest pain. 25 tablet 2  . promethazine (PHENERGAN) 25 MG tablet Take by mouth.    . sulfamethoxazole-trimethoprim (BACTRIM DS) 800-160 MG tablet Take 1 tablet by mouth 2 (two) times daily.    . tamsulosin (FLOMAX) 0.4 MG CAPS capsule Take 0.4 mg by mouth daily.     No current facility-administered medications on file prior to visit.   Allergies  Allergen Reactions  . Avelox [Moxifloxacin] Anaphylaxis, Hives, Swelling and Other (See Comments)  . Bee Venom Anaphylaxis, Hives, Swelling and Other (See Comments)  .  Ciprofloxacin Anaphylaxis, Hives, Swelling and Other (See Comments)  . Iodinated Diagnostic Agents Hives, Rash and Shortness Of Breath  . Iohexol Anaphylaxis, Hives, Swelling and Other (See Comments)     Code: HIVES, Dyspnea and anaphylaxis.  Desc: Do not give IV contrast; pt premedicated with radiologists prep; hives occurred; pt had prev reaction w/ md prep; prev reaction hives; dyspnea; throat swelling, Onset Date: [id number]     No results found for this  or any previous visit (from the past 2160 hour(s)).  Objective: General: Patient is awake, alert, and oriented x 3 and in no acute distress.  Integument: Skin is warm, dry and supple bilateral.  Preulcerative lesions noted bilateral second toes with dry heme no opening resolved erythema no edema to toes.  There is also a preulcerative lesion noted to the fifth metatarsal base on the lateral side of the right foot with no signs of infection.  Nails x10 are short and thickened with dry heme bilateral second toes that appears to be stable.  Vasculature:  Dorsalis Pedis pulse 1/4 bilateral. Posterior Tibial pulse  0/4 bilateral. Capillary fill time <5 sec 1-5 bilateral. Positive hair growth to the level of the digits.Temperature gradient within normal limits no increase in temperature over the right anterior shin area resolving cellulitis appears now to be changes consistent with PVD.  1+ pitting edema noted bilateral right greater than left.  Neurology: The patient has absent sensation measured with a 5.07/10g Semmes Weinstein Monofilament at all pedal sites bilateral . Vibratory sensation absent bilateral with tuning fork. No Babinski sign present bilateral.   Musculoskeletal: Long second toes bilateral.  No tenderness with calf compression bilateral.  Assessment and Plan: Problem List Items Addressed This Visit      Endocrine   Diabetic neuropathy ([place])     Other   Cellulitis of right lower extremity    Other Visit Diagnoses    Pre-ulcerative calluses    -  Primary   Right foot pain       PVD (peripheral vascular disease) ([place])         -Examined patient. -Re-Discussed and educated patient on diabetic foot care and care for preulcerative lesions bilateral and resolving cellulitis in the setting of PVD -Mechanically debrided pre-ulcerative callus at 2nd toes and lateral foot on right using tissue nipper without incident  -Advised patient to continue with compression garments for edema  control if fails to improve swelling discussed with PCP about taking additional dose of Lasix -Continue with offloading padding to shoe -Advised patient to use foot miracle for leg and dry skin areas like previous -Return to office in 8 weeks for toe check.   Landis Martins, DPM

## 2020-10-10 ENCOUNTER — Encounter: Payer: Self-pay | Admitting: Sports Medicine

## 2020-10-10 ENCOUNTER — Ambulatory Visit (INDEPENDENT_AMBULATORY_CARE_PROVIDER_SITE_OTHER): Payer: Self-pay | Admitting: Sports Medicine

## 2020-10-10 ENCOUNTER — Other Ambulatory Visit: Payer: Self-pay

## 2020-10-10 DIAGNOSIS — M79671 Pain in right foot: Secondary | ICD-10-CM

## 2020-10-10 DIAGNOSIS — E0842 Diabetes mellitus due to underlying condition with diabetic polyneuropathy: Secondary | ICD-10-CM

## 2020-10-10 DIAGNOSIS — L84 Corns and callosities: Secondary | ICD-10-CM

## 2020-10-10 DIAGNOSIS — L03115 Cellulitis of right lower limb: Secondary | ICD-10-CM

## 2020-10-10 NOTE — Progress Notes (Signed)
Subjective: [name] is a 64 y.o. male patient with history of diabetes who returns to office today for follow-up evaluation of bilateral toe ulcers and cellulitis.  Patient reports that his toes are doing good did have one episode of increased redness and swelling on the right shin and had to go back on antibiotics but now that has resolved.  No other issues noted.   Patient Active Problem List   Diagnosis Date Noted  . Cellulitis of right lower extremity 06/12/2020  . Disorder of liver 06/12/2020  . 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  . Tobacco use 06/12/2020  . Urinary retention 06/12/2020  . Prostatitis 08/26/2015  . Nausea 08/25/2015  . Dehydration 08/25/2015  . Uncontrolled diabetes mellitus ([place]) 08/25/2015  . Diabetic neuropathy ([place]) 08/25/2015  . Diabetic nephropathy ([place]) 08/25/2015  . Chest pain at rest 08/25/2015  . NSTEMI (non-ST elevated myocardial infarction) ([place]) 08/21/2015  . CAD (coronary artery disease), native coronary artery 08/21/2015  . 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 HTN (hypertension), benign 07/27/2012  . MALIGNANT MELANOMA SKIN TRUNK EXCEPT SCROTUM 01/30/2010  . NEOPLASM, MALIGNANT, COLORECTAL, CARCINOID 01/25/2010  . DIAB W/NEURO MANIFESTS TYPE II/UNS TYPE UNCNTRL 01/25/2010  . NAUSEA AND VOMITING 01/25/2010  . ABDOMINAL PAIN, GENERALIZED 01/25/2010   Current Outpatient Medications on File Prior to Visit  Medication Sig Dispense Refill  . amLODipine (NORVASC) 5 MG tablet Take 1 tablet (5 mg total) by mouth daily. 90 tablet 3  . aspirin 81 MG tablet Take 81 mg by mouth daily.     . carvedilol (COREG) 12.5 MG tablet Take 1 tablet (12.5 mg total) by mouth 2 (two) times daily with a meal. 180 tablet 3  . clopidogrel (PLAVIX) 75 MG tablet Take 1 tablet (75 mg total) by mouth daily. 90 tablet 1  . gabapentin (NEURONTIN) 300 MG capsule Take 1 capsule (300 mg total) by mouth 3 (three) times daily.  (Patient taking differently: Take 300 mg by mouth 4 (four) times daily. ) 90 capsule 1  . insulin aspart (NOVOLOG) 100 UNIT/ML injection Inject 20 Units into the skin 2 (two) times daily.     . insulin glargine (LANTUS) 100 UNIT/ML injection Inject 50-75 Units into the skin at bedtime. Takes 62  In the mornings. Takes 58 units in evenings.    Marland Kitchen losartan-hydrochlorothiazide (HYZAAR) 100-25 MG tablet Take 1 tablet by mouth every morning. 90 tablet 3  . metFORMIN (GLUCOPHAGE) 500 MG tablet Take 4 tablets (2,000 mg total) by mouth daily.    . Multiple Vitamin (MULTIVITAMIN) capsule Take 1 capsule by mouth daily.    . nitroGLYCERIN (NITROSTAT) 0.4 MG SL tablet Place 1 tablet (0.4 mg total) under the tongue every 5 (five) minutes as needed for chest pain. 25 tablet 2  . promethazine (PHENERGAN) 25 MG tablet Take by mouth.    . sulfamethoxazole-trimethoprim (BACTRIM DS) 800-160 MG tablet Take 1 tablet by mouth 2 (two) times daily.    . tamsulosin (FLOMAX) 0.4 MG CAPS capsule Take 0.4 mg by mouth daily.     No current facility-administered medications on file prior to visit.   Allergies  Allergen Reactions  . Avelox [Moxifloxacin] Anaphylaxis, Hives, Swelling and Other (See Comments)  . Bee Venom Anaphylaxis, Hives, Swelling and Other (See Comments)  . Ciprofloxacin Anaphylaxis, Hives, Swelling and Other (See Comments)  . Iodinated Diagnostic Agents Hives, Rash and Shortness Of Breath  . Iohexol Anaphylaxis, Hives, Swelling and Other (See Comments)     Code:  HIVES, Dyspnea and anaphylaxis.  Desc: Do not give IV contrast; pt premedicated with radiologists prep; hives occurred; pt had prev reaction w/ md prep; prev reaction hives; dyspnea; throat swelling, Onset Date: [id number]     No results found for this or any previous visit (from the past 2160 hour(s)).  Objective: General: Patient is awake, alert, and oriented x 3 and in no acute distress.  Integument: Skin is warm, dry and supple bilateral.   Preulcerative lesions noted bilateral second toes with reactive keratosis and minimal dry heme no opening resolved erythema no edema to toes.  There is also a preulcerative lesion noted to the fifth metatarsal base on the lateral side of the right foot with no signs of infection that appears to be minimal.  Nails x10 are short and thickened with dry heme bilateral second toes that appears to be stable.  Vasculature:  Dorsalis Pedis pulse 1/4 bilateral. Posterior Tibial pulse  0/4 bilateral. Capillary fill time <5 sec 1-5 bilateral. Positive hair growth to the level of the digits.Temperature gradient within normal limits no increase in temperature over the right anterior shin area resolving cellulitis appears now to be changes consistent with PVD.  1+ pitting edema noted bilateral right greater than left.  Neurology: The patient has absent sensation measured with a 5.07/10g Semmes Weinstein Monofilament at all pedal sites bilateral . Vibratory sensation absent bilateral with tuning fork. No Babinski sign present bilateral.   Musculoskeletal: Long second toes bilateral.  No tenderness with calf compression bilateral.  Assessment and Plan: Problem List Items Addressed This Visit      Endocrine   Diabetic neuropathy ([place])     Other   Cellulitis of right lower extremity    Other Visit Diagnoses    Pre-ulcerative calluses    -  Primary   Right foot pain         -Examined patient. -Re-Discussed and educated patient on diabetic foot care and care for preulcerative lesions bilateral and resolved cellulitis in the setting of PVD -Mechanically debrided pre-ulcerative callus at 2nd toes and lateral foot on right using tissue nipper without incident  -Advised patient to continue with compression garments for edema like previous -Continue with daily skin emollients to dry skin areas on feet or legs -Return as needed or sooner problems or issues arise.  Landis Martins, DPM

## 2020-11-18 ENCOUNTER — Other Ambulatory Visit: Payer: Self-pay | Admitting: Cardiology

## 2020-11-18 DIAGNOSIS — I739 Peripheral vascular disease, unspecified: Secondary | ICD-10-CM

## 2020-11-18 DIAGNOSIS — L98491 Non-pressure chronic ulcer of skin of other sites limited to breakdown of skin: Secondary | ICD-10-CM

## 2020-12-25 ENCOUNTER — Ambulatory Visit: Payer: Self-pay | Admitting: Cardiology

## 2021-01-02 ENCOUNTER — Other Ambulatory Visit: Payer: Self-pay

## 2021-01-02 DIAGNOSIS — I2510 Atherosclerotic heart disease of native coronary artery without angina pectoris: Secondary | ICD-10-CM

## 2021-01-02 DIAGNOSIS — I10 Essential (primary) hypertension: Secondary | ICD-10-CM

## 2021-01-02 MED ORDER — CARVEDILOL 12.5 MG PO TABS: 12.5000 mg | ORAL_TABLET | Freq: Two times a day (BID) | ORAL | 3 refills | Status: DC

## 2021-01-04 ENCOUNTER — Ambulatory Visit: Payer: Self-pay | Admitting: Cardiology
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
## 2021-02-06 ENCOUNTER — Encounter: Payer: Self-pay | Admitting: Student

## 2021-02-06 ENCOUNTER — Ambulatory Visit: Payer: Self-pay | Admitting: Student

## 2021-02-06 ENCOUNTER — Other Ambulatory Visit: Payer: Self-pay

## 2021-02-06 VITALS — BP 132/74 | HR 51 | Temp 98.0°F | Resp 16 | Ht 76.0 in | Wt 317.0 lb

## 2021-02-06 DIAGNOSIS — Z720 Tobacco use: Secondary | ICD-10-CM

## 2021-02-06 DIAGNOSIS — I2510 Atherosclerotic heart disease of native coronary artery without angina pectoris: Secondary | ICD-10-CM

## 2021-02-06 DIAGNOSIS — E7800 Pure hypercholesterolemia, unspecified: Secondary | ICD-10-CM

## 2021-02-06 DIAGNOSIS — Z006 Encounter for examination for normal comparison and control in clinical research program: Secondary | ICD-10-CM

## 2021-02-06 DIAGNOSIS — I340 Nonrheumatic mitral (valve) insufficiency: Secondary | ICD-10-CM

## 2021-02-06 DIAGNOSIS — I10 Essential (primary) hypertension: Secondary | ICD-10-CM

## 2021-02-06 MED ORDER — TADALAFIL 10 MG PO TABS: 10.0000 mg | ORAL_TABLET | Freq: Every day | ORAL | 0 refills | Status: DC | PRN

## 2021-02-06 NOTE — Patient Instructions (Signed)
Focus on weight loss, diet compliance, diabetes control, and stopping tobacco. Monitor blood pressure regularly, notify our office if blood pressure >130/80 mmHg.

## 2021-02-16 ENCOUNTER — Other Ambulatory Visit: Payer: Self-pay | Admitting: Cardiology

## 2021-02-16 DIAGNOSIS — L98491 Non-pressure chronic ulcer of skin of other sites limited to breakdown of skin: Secondary | ICD-10-CM

## 2021-02-16 DIAGNOSIS — I739 Peripheral vascular disease, unspecified: Secondary | ICD-10-CM

## 2021-05-14 ENCOUNTER — Other Ambulatory Visit: Payer: Self-pay | Admitting: Cardiology

## 2021-05-14 DIAGNOSIS — I739 Peripheral vascular disease, unspecified: Secondary | ICD-10-CM

## 2021-05-14 DIAGNOSIS — L98491 Non-pressure chronic ulcer of skin of other sites limited to breakdown of skin: Secondary | ICD-10-CM

## 2021-05-16 ENCOUNTER — Ambulatory Visit (INDEPENDENT_AMBULATORY_CARE_PROVIDER_SITE_OTHER): Payer: Self-pay | Admitting: Sports Medicine

## 2021-05-16 ENCOUNTER — Other Ambulatory Visit: Payer: Self-pay | Admitting: Sports Medicine

## 2021-05-16 ENCOUNTER — Other Ambulatory Visit: Payer: Self-pay

## 2021-05-16 ENCOUNTER — Encounter: Payer: Self-pay | Admitting: Sports Medicine

## 2021-05-16 DIAGNOSIS — L84 Corns and callosities: Secondary | ICD-10-CM

## 2021-05-16 DIAGNOSIS — E0842 Diabetes mellitus due to underlying condition with diabetic polyneuropathy: Secondary | ICD-10-CM

## 2021-05-16 DIAGNOSIS — I739 Peripheral vascular disease, unspecified: Secondary | ICD-10-CM

## 2021-05-16 NOTE — Progress Notes (Signed)
Subjective: [name] is a 65 y.o. male patient with history of diabetes who returns to office today for follow-up evaluation of bilateral second toes.  Patient reports that things are doing good no opening just gets a little hard skin at the tip of the toe and the nail but otherwise doing good no pain today.  Fasting blood sugar 150 A1c 8.3 and last visit to PCP was last week.   Patient Active Problem List   Diagnosis Date Noted  . Cellulitis of right lower extremity 06/12/2020  . Disorder of liver 06/12/2020  . 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  . Tobacco use 06/12/2020  . Urinary retention 06/12/2020  . Prostatitis 08/26/2015  . Nausea 08/25/2015  . Dehydration 08/25/2015  . Uncontrolled diabetes mellitus ([place]) 08/25/2015  . Diabetic neuropathy ([place]) 08/25/2015  . Diabetic nephropathy ([place]) 08/25/2015  . Chest pain at rest 08/25/2015  . NSTEMI (non-ST elevated myocardial infarction) ([place]) 08/21/2015  . CAD (coronary artery disease), native coronary artery 08/21/2015  . 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 HTN (hypertension), benign 07/27/2012  . MALIGNANT MELANOMA SKIN TRUNK EXCEPT SCROTUM 01/30/2010  . NEOPLASM, MALIGNANT, COLORECTAL, CARCINOID 01/25/2010  . DIAB W/NEURO MANIFESTS TYPE II/UNS TYPE UNCNTRL 01/25/2010  . NAUSEA AND VOMITING 01/25/2010  . ABDOMINAL PAIN, GENERALIZED 01/25/2010   Current Outpatient Medications on File Prior to Visit  Medication Sig Dispense Refill  . aspirin 81 MG tablet Take 81 mg by mouth daily.     . carvedilol (COREG) 12.5 MG tablet Take 1 tablet (12.5 mg total) by mouth 2 (two) times daily with a meal. 180 tablet 3  . clopidogrel (PLAVIX) 75 MG tablet TAKE ONE TABLET BY MOUTH ONE TIME DAILY 90 tablet 0  . gabapentin (NEURONTIN) 300 MG capsule Take 1 capsule (300 mg total) by mouth 3 (three) times daily. (Patient taking differently: Take 300 mg by mouth 4 (four) times daily.) 90 capsule 1  .  insulin aspart (NOVOLOG) 100 UNIT/ML injection Inject 20 Units into the skin 2 (two) times daily.     . insulin glargine (LANTUS) 100 UNIT/ML injection Inject 50-75 Units into the skin at bedtime. Takes 62  In the mornings. Takes 58 units in evenings.    Marland Kitchen losartan-hydrochlorothiazide (HYZAAR) 100-12.5 MG tablet Take 1 tablet by mouth daily.    . metFORMIN (GLUCOPHAGE) 500 MG tablet Take 4 tablets (2,000 mg total) by mouth daily.    . Multiple Vitamin (MULTIVITAMIN) capsule Take 1 capsule by mouth daily.    . nitroGLYCERIN (NITROSTAT) 0.4 MG SL tablet Place 1 tablet (0.4 mg total) under the tongue every 5 (five) minutes as needed for chest pain. 25 tablet 2  . tadalafil (CIALIS) 10 MG tablet Take 1 tablet (10 mg total) by mouth daily as needed for erectile dysfunction. 10 tablet 0  . tamsulosin (FLOMAX) 0.4 MG CAPS capsule Take 0.4 mg by mouth daily.     No current facility-administered medications on file prior to visit.   Allergies  Allergen Reactions  . Avelox [Moxifloxacin] Anaphylaxis, Hives, Swelling and Other (See Comments)  . Bee Venom Anaphylaxis, Hives, Swelling and Other (See Comments)  . Ciprofloxacin Anaphylaxis, Hives, Swelling and Other (See Comments)  . Iodinated Diagnostic Agents Hives, Rash and Shortness Of Breath  . Iohexol Anaphylaxis, Hives, Swelling and Other (See Comments)     Code: HIVES, Dyspnea and anaphylaxis.  Desc: Do not give IV contrast; pt premedicated with radiologists prep; hives occurred; pt had prev reaction w/ md  prep; prev reaction hives; dyspnea; throat swelling, Onset Date: [id number]     No results found for this or any previous visit (from the past 2160 hour(s)).  Objective: General: Patient is awake, alert, and oriented x 3 and in no acute distress.  Integument: Skin is warm, dry and supple bilateral.  Preulcerative lesions noted bilateral second toes with reactive keratosis and thickening of the nails to these toes.  No open lesions or signs of  infection.  Vasculature:  Dorsalis Pedis pulse 1/4 bilateral. Posterior Tibial pulse  0/4 bilateral. Capillary fill time <5 sec 1-5 bilateral. Positive hair growth to the level of the digits.Temperature gradient within normal limits.  Neurology: The patient has absent sensation measured with a 5.07/10g Semmes Weinstein Monofilament at all pedal sites bilateral . Vibratory sensation absent bilateral with tuning fork. No Babinski sign present bilateral.   Musculoskeletal: Long second toes bilateral.  No tenderness with calf compression bilateral.  Assessment and Plan: Problem List Items Addressed This Visit      Endocrine   Diabetic neuropathy ([place])    Other Visit Diagnoses    Pre-ulcerative calluses    -  Primary   PVD (peripheral vascular disease) ([place])         -Examined patient. -Re-Discussed and educated patient on diabetic foot care and preulcerative lesions to toes -Mechanically debrided pre-ulcerative callus at 2nd toes with tissue nipper without incident and debridement nails at these corresponding toes without incident -Educated patient on proper fitting shoes -Advised patient to continue with compression garments for edema like previous -Advised patient to return to office annually for follow-up diabetic foot exams. Landis Martins, DPM

## 2021-05-17 ENCOUNTER — Encounter: Payer: Self-pay | Admitting: Cardiology

## 2021-05-17 DIAGNOSIS — I5032 Chronic diastolic (congestive) heart failure: Secondary | ICD-10-CM

## 2021-05-17 DIAGNOSIS — Z006 Encounter for examination for normal comparison and control in clinical research program: Secondary | ICD-10-CM

## 2021-05-17 DIAGNOSIS — E7800 Pure hypercholesterolemia, unspecified: Secondary | ICD-10-CM

## 2021-05-17 DIAGNOSIS — I10 Essential (primary) hypertension: Secondary | ICD-10-CM

## 2021-05-17 NOTE — Progress Notes (Signed)
ICD-10-CM   1. Exam for clinical research. End of study visit 05/17/21  Z00.6   2. Enrolled in clinical trial of drug: ESPERION:  clinical trial with Bempidoic acid for hyperlipidemia  Z00.6   3. Essential hypertension  I10 CBC    CMP14+EGFR  4. Pure hypercholesterolemia  E78.00 Lipid Panel With LDL/HDL Ratio    LDL cholesterol, direct  5. Chronic diastolic heart failure (HCC)  I50.32 Pro b natriuretic peptide (BNP)    Enrolled in clinical trial of drug: ESPERION:  clinical trial with Bempidoic acid for hyperlipidemia  Patient is here for endostatin visit, I will set him up for an office visit to discuss options for management of lipids.  He will also need lipid profile testing.   Adrian Prows, MD, [facility] 05/17/2021, 2:36 PM Office: [phone] Fax: [phone] Pager: [phone]

## 2021-06-11 ENCOUNTER — Other Ambulatory Visit: Payer: Self-pay | Admitting: Cardiology

## 2021-06-12 LAB — CBC
Hematocrit: 46 % (ref 37.5–51.0)
Hemoglobin: 15.6 g/dL (ref 13.0–17.7)
MCH: 30.2 pg (ref 26.6–33.0)
MCHC: 33.9 g/dL (ref 31.5–35.7)
MCV: 89 fL (ref 79–97)
Platelets: 220 10*3/uL (ref 150–450)
RBC: 5.16 x10E6/uL (ref 4.14–5.80)
RDW: 12.3 % (ref 11.6–15.4)
WBC: 8 10*3/uL (ref 3.4–10.8)

## 2021-06-12 LAB — CMP14+EGFR
ALT: 44 IU/L (ref 0–44)
AST: 22 IU/L (ref 0–40)
Albumin/Globulin Ratio: 1.7 (ref 1.2–2.2)
Albumin: 4.5 g/dL (ref 3.8–4.8)
Alkaline Phosphatase: 55 IU/L (ref 44–121)
BUN/Creatinine Ratio: 12 (ref 10–24)
BUN: 15 mg/dL (ref 8–27)
Bilirubin Total: 0.4 mg/dL (ref 0.0–1.2)
CO2: 29 mmol/L (ref 20–29)
Calcium: 9.7 mg/dL (ref 8.6–10.2)
Chloride: 99 mmol/L (ref 96–106)
Creatinine, Ser: 1.22 mg/dL (ref 0.76–1.27)
Globulin, Total: 2.6 g/dL (ref 1.5–4.5)
Glucose: 145 mg/dL — ABNORMAL HIGH (ref 65–99)
Potassium: 4.4 mmol/L (ref 3.5–5.2)
Sodium: 143 mmol/L (ref 134–144)
Total Protein: 7.1 g/dL (ref 6.0–8.5)
eGFR: 67 mL/min/{1.73_m2} (ref >59)

## 2021-06-12 LAB — LIPID PANEL WITH LDL/HDL RATIO
Cholesterol, Total: 211 mg/dL — ABNORMAL HIGH (ref 100–199)
HDL: 54 mg/dL (ref >39)
LDL Chol Calc (NIH): 134 mg/dL — ABNORMAL HIGH (ref 0–99)
LDL/HDL Ratio: 2.5 ratio (ref 0.0–3.6)
Triglycerides: 131 mg/dL (ref 0–149)
VLDL Cholesterol Cal: 23 mg/dL (ref 5–40)

## 2021-06-12 LAB — LDL CHOLESTEROL, DIRECT: LDL Direct: 132 mg/dL — ABNORMAL HIGH (ref 0–99)

## 2021-06-12 LAB — PRO B NATRIURETIC PEPTIDE: NT-Pro BNP: 127 pg/mL (ref 0–210)

## 2021-06-19 ENCOUNTER — Ambulatory Visit: Payer: Self-pay | Admitting: Cardiology

## 2021-07-20 ENCOUNTER — Ambulatory Visit: Payer: Self-pay | Admitting: Cardiology

## 2021-07-30 ENCOUNTER — Other Ambulatory Visit: Payer: Self-pay

## 2021-08-06 ENCOUNTER — Ambulatory Visit: Payer: Self-pay | Admitting: Student

## 2021-08-14 ENCOUNTER — Other Ambulatory Visit: Payer: Self-pay

## 2021-08-14 ENCOUNTER — Ambulatory Visit: Payer: Self-pay

## 2021-08-20 ENCOUNTER — Other Ambulatory Visit: Payer: Self-pay

## 2021-08-20 ENCOUNTER — Encounter: Payer: Self-pay | Admitting: Student

## 2021-08-20 ENCOUNTER — Ambulatory Visit: Payer: Self-pay | Admitting: Student

## 2021-08-20 ENCOUNTER — Other Ambulatory Visit: Payer: Self-pay | Admitting: Student

## 2021-08-20 VITALS — BP 124/74 | HR 56 | Temp 97.6°F | Ht 76.0 in | Wt 316.2 lb

## 2021-08-20 DIAGNOSIS — L98491 Non-pressure chronic ulcer of skin of other sites limited to breakdown of skin: Secondary | ICD-10-CM

## 2021-08-20 DIAGNOSIS — R0601 Orthopnea: Secondary | ICD-10-CM

## 2021-08-20 DIAGNOSIS — Z006 Encounter for examination for normal comparison and control in clinical research program: Secondary | ICD-10-CM

## 2021-08-20 DIAGNOSIS — I739 Peripheral vascular disease, unspecified: Secondary | ICD-10-CM

## 2021-08-20 DIAGNOSIS — I10 Essential (primary) hypertension: Secondary | ICD-10-CM

## 2021-08-20 DIAGNOSIS — I340 Nonrheumatic mitral (valve) insufficiency: Secondary | ICD-10-CM

## 2021-08-20 DIAGNOSIS — E7800 Pure hypercholesterolemia, unspecified: Secondary | ICD-10-CM
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
## 2021-08-24 ENCOUNTER — Other Ambulatory Visit: Payer: Self-pay | Admitting: Student

## 2021-08-28 ENCOUNTER — Ambulatory Visit: Payer: Self-pay

## 2021-08-28 ENCOUNTER — Other Ambulatory Visit: Payer: Self-pay

## 2021-08-28 DIAGNOSIS — I340 Nonrheumatic mitral (valve) insufficiency: Secondary | ICD-10-CM

## 2021-09-20 LAB — PRO B NATRIURETIC PEPTIDE: NT-Pro BNP: 70 pg/mL (ref 0–210)

## 2021-10-01 ENCOUNTER — Ambulatory Visit: Payer: Self-pay | Admitting: Student

## 2021-10-01 ENCOUNTER — Encounter: Payer: Self-pay | Admitting: Student

## 2021-10-01 ENCOUNTER — Other Ambulatory Visit: Payer: Self-pay

## 2021-10-01 VITALS — BP 156/85 | HR 66 | Ht 76.0 in | Wt 316.4 lb

## 2021-10-01 DIAGNOSIS — E7800 Pure hypercholesterolemia, unspecified: Secondary | ICD-10-CM

## 2021-10-01 DIAGNOSIS — I340 Nonrheumatic mitral (valve) insufficiency: Secondary | ICD-10-CM

## 2021-10-01 DIAGNOSIS — I10 Essential (primary) hypertension: Secondary | ICD-10-CM
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
## 2021-11-16 ENCOUNTER — Other Ambulatory Visit: Payer: Self-pay | Admitting: Student

## 2021-11-16 DIAGNOSIS — I739 Peripheral vascular disease, unspecified: Secondary | ICD-10-CM

## 2021-11-16 DIAGNOSIS — L98491 Non-pressure chronic ulcer of skin of other sites limited to breakdown of skin: Secondary | ICD-10-CM

## 2021-12-31 ENCOUNTER — Other Ambulatory Visit: Payer: Self-pay | Admitting: Cardiology

## 2021-12-31 DIAGNOSIS — I2510 Atherosclerotic heart disease of native coronary artery without angina pectoris: Secondary | ICD-10-CM

## 2021-12-31 DIAGNOSIS — I10 Essential (primary) hypertension: Secondary | ICD-10-CM

## 2022-02-18 ENCOUNTER — Other Ambulatory Visit: Payer: Self-pay | Admitting: Student

## 2022-02-18 DIAGNOSIS — L98491 Non-pressure chronic ulcer of skin of other sites limited to breakdown of skin: Secondary | ICD-10-CM

## 2022-02-18 DIAGNOSIS — I739 Peripheral vascular disease, unspecified: Secondary | ICD-10-CM

## 2022-02-21 ENCOUNTER — Ambulatory Visit: Payer: Commercial Managed Care - HMO | Admitting: Student

## 2022-02-21 ENCOUNTER — Encounter: Payer: Self-pay | Admitting: Student

## 2022-02-21 ENCOUNTER — Other Ambulatory Visit: Payer: Self-pay

## 2022-02-21 VITALS — BP 135/80 | HR 57 | Temp 98.0°F | Ht 76.0 in | Wt 315.0 lb

## 2022-02-21 DIAGNOSIS — E7800 Pure hypercholesterolemia, unspecified: Secondary | ICD-10-CM

## 2022-02-21 DIAGNOSIS — I10 Essential (primary) hypertension: Secondary | ICD-10-CM

## 2022-02-21 DIAGNOSIS — G4733 Obstructive sleep apnea (adult) (pediatric): Secondary | ICD-10-CM
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
## 2022-03-20 ENCOUNTER — Telehealth: Payer: Self-pay

## 2022-03-20 NOTE — Telephone Encounter (Signed)
Attempted to call pt three times regarding pt assistance paperwork for Repatha. No attempt has been made to contact us at this time. ?

## 2022-03-20 NOTE — Telephone Encounter (Signed)
-----   Message from Alethia Berthold, [place] sent at 03/15/2022  2:30 PM EDT ----- ?Can you please check to make sure patient dropped off Repatha assistance paperwork?  ? ?

## 2022-04-02 ENCOUNTER — Other Ambulatory Visit: Payer: Self-pay | Admitting: Cardiology

## 2022-04-02 DIAGNOSIS — I2510 Atherosclerotic heart disease of native coronary artery without angina pectoris: Secondary | ICD-10-CM

## 2022-04-02 DIAGNOSIS — I10 Essential (primary) hypertension: Secondary | ICD-10-CM

## 2022-05-13 ENCOUNTER — Other Ambulatory Visit: Payer: Self-pay | Admitting: Student

## 2022-05-13 DIAGNOSIS — L98491 Non-pressure chronic ulcer of skin of other sites limited to breakdown of skin: Secondary | ICD-10-CM

## 2022-05-13 DIAGNOSIS — I739 Peripheral vascular disease, unspecified: Secondary | ICD-10-CM

## 2022-07-01 ENCOUNTER — Other Ambulatory Visit: Payer: Self-pay | Admitting: Cardiology

## 2022-07-01 DIAGNOSIS — I2510 Atherosclerotic heart disease of native coronary artery without angina pectoris: Secondary | ICD-10-CM

## 2022-07-01 DIAGNOSIS — I10 Essential (primary) hypertension: Secondary | ICD-10-CM

## 2022-07-03 ENCOUNTER — Emergency Department (HOSPITAL_COMMUNITY)
Admission: EM | Admit: 2022-07-03 | Discharge: 2022-07-03 | Disposition: A | Discharge status: Home / Self Care | Payer: Commercial Managed Care - HMO | Attending: Emergency Medicine | Admitting: Emergency Medicine

## 2022-07-03 ENCOUNTER — Emergency Department (HOSPITAL_COMMUNITY): Payer: Commercial Managed Care - HMO

## 2022-07-03 ENCOUNTER — Other Ambulatory Visit: Payer: Self-pay

## 2022-07-03 ENCOUNTER — Encounter (HOSPITAL_COMMUNITY): Payer: Self-pay

## 2022-07-03 DIAGNOSIS — R61 Generalized hyperhidrosis: Secondary | ICD-10-CM | POA: Insufficient documentation

## 2022-07-03 DIAGNOSIS — I10 Essential (primary) hypertension: Secondary | ICD-10-CM | POA: Insufficient documentation

## 2022-07-03 DIAGNOSIS — I959 Hypotension, unspecified: Secondary | ICD-10-CM | POA: Diagnosis not present

## 2022-07-03 DIAGNOSIS — Z7984 Long term (current) use of oral hypoglycemic drugs: Secondary | ICD-10-CM | POA: Insufficient documentation

## 2022-07-03 DIAGNOSIS — I2510 Atherosclerotic heart disease of native coronary artery without angina pectoris: Secondary | ICD-10-CM | POA: Insufficient documentation

## 2022-07-03 DIAGNOSIS — Z7901 Long term (current) use of anticoagulants: Secondary | ICD-10-CM | POA: Diagnosis not present

## 2022-07-03 DIAGNOSIS — Z79899 Other long term (current) drug therapy: Secondary | ICD-10-CM | POA: Diagnosis not present

## 2022-07-03 DIAGNOSIS — E1165 Type 2 diabetes mellitus with hyperglycemia: Secondary | ICD-10-CM | POA: Insufficient documentation

## 2022-07-03 DIAGNOSIS — R7989 Other specified abnormal findings of blood chemistry: Secondary | ICD-10-CM | POA: Diagnosis not present

## 2022-07-03 DIAGNOSIS — E876 Hypokalemia: Secondary | ICD-10-CM | POA: Diagnosis not present

## 2022-07-03 DIAGNOSIS — Z7982 Long term (current) use of aspirin: Secondary | ICD-10-CM | POA: Insufficient documentation

## 2022-07-03 DIAGNOSIS — Z794 Long term (current) use of insulin: Secondary | ICD-10-CM | POA: Insufficient documentation

## 2022-07-03 DIAGNOSIS — R55 Syncope and collapse: Secondary | ICD-10-CM

## 2022-07-03 DIAGNOSIS — R42 Dizziness and giddiness: Secondary | ICD-10-CM | POA: Diagnosis present

## 2022-07-03 LAB — COMPREHENSIVE METABOLIC PANEL
ALT: 42 U/L (ref 0–44)
AST: 32 U/L (ref 15–41)
Albumin: 3.3 g/dL — ABNORMAL LOW (ref 3.5–5.0)
Alkaline Phosphatase: 35 U/L — ABNORMAL LOW (ref 38–126)
Anion gap: 13 (ref 5–15)
BUN: 15 mg/dL (ref 8–23)
CO2: 27 mmol/L (ref 22–32)
Calcium: 8.1 mg/dL — ABNORMAL LOW (ref 8.9–10.3)
Chloride: 103 mmol/L (ref 98–111)
Creatinine, Ser: 1.3 mg/dL — ABNORMAL HIGH (ref 0.61–1.24)
GFR, Estimated: >60 mL/min (ref >60)
Glucose, Bld: 100 mg/dL — ABNORMAL HIGH (ref 70–99)
Potassium: 2.7 mmol/L — CL (ref 3.5–5.1)
Sodium: 143 mmol/L (ref 135–145)
Total Bilirubin: 0.7 mg/dL (ref 0.3–1.2)
Total Protein: 6 g/dL — ABNORMAL LOW (ref 6.5–8.1)

## 2022-07-03 LAB — LIPASE, BLOOD: Lipase: 24 U/L (ref 11–51)

## 2022-07-03 LAB — CBC WITH DIFFERENTIAL/PLATELET
Abs Immature Granulocytes: 0.09 10*3/uL — ABNORMAL HIGH (ref 0.00–0.07)
Basophils Absolute: 0.1 10*3/uL (ref 0.0–0.1)
Basophils Relative: 1 %
Eosinophils Absolute: 0.2 10*3/uL (ref 0.0–0.5)
Eosinophils Relative: 2 %
HCT: 42.7 % (ref 39.0–52.0)
Hemoglobin: 14.3 g/dL (ref 13.0–17.0)
Immature Granulocytes: 1 %
Lymphocytes Relative: 18 %
Lymphs Abs: 1.6 10*3/uL (ref 0.7–4.0)
MCH: 30.2 pg (ref 26.0–34.0)
MCHC: 33.5 g/dL (ref 30.0–36.0)
MCV: 90.1 fL (ref 80.0–100.0)
Monocytes Absolute: 0.9 10*3/uL (ref 0.1–1.0)
Monocytes Relative: 10 %
Neutro Abs: 6.3 10*3/uL (ref 1.7–7.7)
Neutrophils Relative %: 68 %
Platelets: 225 10*3/uL (ref 150–400)
RBC: 4.74 MIL/uL (ref 4.22–5.81)
RDW: 13.2 % (ref 11.5–15.5)
WBC: 9.1 10*3/uL (ref 4.0–10.5)
nRBC: 0 % (ref 0.0–0.2)

## 2022-07-03 LAB — MAGNESIUM: Magnesium: 1.4 mg/dL — ABNORMAL LOW (ref 1.7–2.4)

## 2022-07-03 LAB — TROPONIN I (HIGH SENSITIVITY)
Troponin I (High Sensitivity): 13 ng/L (ref <18)
Troponin I (High Sensitivity): 13 ng/L (ref <18)

## 2022-07-03 MED ORDER — POTASSIUM CHLORIDE CRYS ER 20 MEQ PO TBCR: 20.0000 meq | EXTENDED_RELEASE_TABLET | Freq: Two times a day (BID) | ORAL | 0 refills | Status: AC

## 2022-07-03 MED ORDER — MAGNESIUM OXIDE -MG SUPPLEMENT 200 MG PO TABS: 1.0000 | ORAL_TABLET | Freq: Two times a day (BID) | ORAL | 0 refills | Status: AC

## 2022-07-03 MED ORDER — POTASSIUM CHLORIDE CRYS ER 20 MEQ PO TBCR
40.0000 meq | EXTENDED_RELEASE_TABLET | Freq: Once | ORAL | Status: AC
  Administered 2022-07-03: 40 meq via ORAL
  Filled 2022-07-03: qty 2

## 2022-07-03 NOTE — ED Triage Notes (Signed)
Pt BIB [facility] from home where he became dizzy & diaphoretic, so he sat  & felt like he was about to pass out but never had LOC. He remains weak & now has CP that radiate sto his bil shoulders & neck. He does have Hx of stemi & has one stent from 2016. EMS gave 324 ASA, not given any Nitro. 92/50 initially & was given 900 cc NS in a 18g PIV in Lt AC, BP increased to 120/60. 560 bpm, CBG 122. A/Ox4.

## 2022-07-03 NOTE — ED Notes (Signed)
Discharge instructions reviewed with patient, patient verbalized understanding. Patient discharged home with significant other. Patient denies any questions, ambulated from ED w/o difficulty.

## 2022-07-03 NOTE — Discharge Instructions (Signed)
The episode of feeling faint was because of low blood pressure.  This was likely complicated by low potassium and magnesium.  These are probably low because of the blood pressure medicine you are taking.  To help your symptoms we advise several things.  Decrease your carvedilol dose to 6.25 mg, twice a day.  Do not take the hydrochlorothiazide and lisinopril medication for 3 days.  After that you can restart the medicine.  Call your doctor for a follow-up appointment next week.  Make sure you are drinking plenty of fluids and eating 3 meals each day.  Return here, if needed.

## 2022-07-03 NOTE — ED Notes (Signed)
Patient transported to CT 

## 2022-07-03 NOTE — ED Provider Notes (Signed)
[facility] EMERGENCY DEPARTMENT Provider Note   CSN: [id number] Arrival date & time: 07/03/22  1346     History  Chief Complaint  Patient presents with   Chest Pain   Near Syncope    [name] is a 66 y.o. male.  HPI Patient presents by EMS for evaluation of dizziness with diaphoresis.  He was transferred by EMS.  During transfer he complained of chest pain so he was treated with aspirin.  He reported that the chest pain radiated to both shoulders and his neck.  He has history of coronary artery disease with STEMI and coronary stenting.  He had previously been prescribed Repatha but was not taking it.  He has also been recommended to use CPAP for sleep apnea but has not been using it, and states he has been sleeping well.  He did not eat yet today but is hungry and thirsty.  He took his morning medicines.  Onset of symptoms about 2 hours after he woke.  Symptoms started at 11:30 AM.  He describes a sense of dizziness and lightheadedness with decreasing vision, and states he took his blood pressure and it was "70/52."  Last visit with cardiology 02/21/2022, provider is [facility]; evaluated relative to history of known coronary disease.  He has multiple comorbidities including uncontrolled diabetes, neuropathy, hyperlipidemia, hypertension, obesity and tobacco use disorder.  He has been unable to tolerate statins.  He has been diagnosed with sleep apnea and recommended to use CPAP.  Home Medications Prior to Admission medications   Medication Sig Start Date End Date Taking? Authorizing Provider  Magnesium Oxide -Mg Supplement 200 MG TABS Take 1 tablet (200 mg total) by mouth 2 (two) times daily. 07/03/22  Yes Daleen Bo, MD  potassium chloride SA (KLOR-CON M) 20 MEQ tablet Take 1 tablet (20 mEq total) by mouth 2 (two) times daily. 07/03/22  Yes Daleen Bo, MD  aspirin 81 MG tablet Take 81 mg by mouth daily.     [provider]   carvedilol (COREG) 12.5 MG tablet TAKE 1 TABLET BY MOUTH TWICE DAILY WITH A MEAL 07/01/22   Adrian Prows, MD  cefdinir (OMNICEF) 300 MG capsule Take 300 mg by mouth 2 (two) times daily.    [provider]  clopidogrel (PLAVIX) 75 MG tablet TAKE 1 TABLET BY MOUTH ONCE DAILY 05/13/22   Cantwell, Celeste C, PA-C  gabapentin (NEURONTIN) 300 MG capsule Take 1 capsule (300 mg total) by mouth 3 (three) times daily. Patient taking differently: Take 300 mg by mouth 4 (four) times daily. 08/26/15   Adrian Prows, MD  hydrOXYzine (ATARAX/VISTARIL) 25 MG tablet Take 25 mg by mouth 3 (three) times daily as needed.    [provider]  insulin aspart (NOVOLOG) 100 UNIT/ML injection Inject 25 Units into the skin 2 (two) times daily.    [provider]  insulin glargine (LANTUS) 100 UNIT/ML injection Inject 75 Units into the skin at bedtime.    [provider]  losartan-hydrochlorothiazide (HYZAAR) 100-12.5 MG tablet Take 1 tablet by mouth daily.    [provider]  metFORMIN (GLUCOPHAGE) 500 MG tablet Take 4 tablets (2,000 mg total) by mouth daily. 08/24/15   Adrian Prows, MD  Multiple Vitamin (MULTIVITAMIN) capsule Take 1 capsule by mouth daily.    [provider]  nitroGLYCERIN (NITROSTAT) 0.4 MG SL tablet PLACE 1 TABLET UNDER TONGUE EVERY 5 MINUTES AS NEEDED FOR CHEST PAIN.  CALL 911 IF USE THIRD TABLET 08/24/21  Cantwell, Celeste C, PA-C  tadalafil (CIALIS) 10 MG tablet Take 1 tablet (10 mg total) by mouth daily as needed for erectile dysfunction. 02/06/21   Cantwell, Celeste C, PA-C  tamsulosin (FLOMAX) 0.4 MG CAPS capsule Take 0.4 mg by mouth daily.    [provider]      Allergies    Avelox [moxifloxacin], Bee venom, Ciprofloxacin, Iodinated contrast media, and Iohexol    Review of Systems   Review of Systems  Physical Exam Updated Vital Signs BP 114/61   Pulse (!) 58   Temp (!) 97.4 F (36.3 C) (Oral)   Resp 14   SpO2 97%  Physical Exam Vitals  and nursing note reviewed.  Constitutional:      General: He is not in acute distress.    Appearance: He is well-developed. He is obese. He is not ill-appearing, toxic-appearing or diaphoretic.  HENT:     Head: Normocephalic and atraumatic.     Right Ear: External ear normal.     Left Ear: External ear normal.     Nose: No congestion or rhinorrhea.     Mouth/Throat:     Mouth: Mucous membranes are moist.  Eyes:     Conjunctiva/sclera: Conjunctivae normal.     Pupils: Pupils are equal, round, and reactive to light.  Neck:     Trachea: Phonation normal.  Cardiovascular:     Rate and Rhythm: Normal rate and regular rhythm.     Heart sounds: Normal heart sounds.  Pulmonary:     Effort: Pulmonary effort is normal.     Breath sounds: Normal breath sounds.  Abdominal:     General: There is no distension.     Palpations: Abdomen is soft.     Tenderness: There is no abdominal tenderness.  Musculoskeletal:        General: Normal range of motion.     Cervical back: Normal range of motion and neck supple.  Skin:    General: Skin is warm and dry.  Neurological:     Mental Status: He is alert and oriented to person, place, and time.     Cranial Nerves: No cranial nerve deficit.     Sensory: No sensory deficit.     Motor: No abnormal muscle tone.     Coordination: Coordination normal.  Psychiatric:        Mood and Affect: Mood normal.        Behavior: Behavior normal.        Thought Content: Thought content normal.        Judgment: Judgment normal.     ED Results / Procedures / Treatments   Labs (all labs ordered are listed, but only abnormal results are displayed) Labs Reviewed  CBC WITH DIFFERENTIAL/PLATELET - Abnormal; Notable for the following components:      Result Value   Abs Immature Granulocytes 0.09 (*)    All other components within normal limits  COMPREHENSIVE METABOLIC PANEL - Abnormal; Notable for the following components:   Potassium 2.7 (*)    Glucose, Bld 100  (*)    Creatinine, Ser 1.30 (*)    Calcium 8.1 (*)    Total Protein 6.0 (*)    Albumin 3.3 (*)    Alkaline Phosphatase 35 (*)    All other components within normal limits  MAGNESIUM - Abnormal; Notable for the following components:   Magnesium 1.4 (*)    All other components within normal limits  LIPASE, BLOOD  TROPONIN I (HIGH SENSITIVITY)  TROPONIN I (  HIGH SENSITIVITY)    EKG EKG Interpretation  Date/Time:  Wednesday July 03 2022 13:53:59 EDT Ventricular Rate:  53 PR Interval:  227 QRS Duration: 118 QT Interval:  488 QTC Calculation: 459 R Axis:   -38 Text Interpretation: Sinus rhythm Prolonged PR interval Nonspecific IVCD with LAD Probable anterior infarct, age indeterminate STW changes lateral leads new in comparison to prior Confirmed by Gareth Morgan [phone]) on 07/03/2022 2:00:39 PM  Radiology CT Head Wo Contrast  Result Date: 07/03/2022 CLINICAL DATA:  Headache, new or worse. EXAM: CT HEAD WITHOUT CONTRAST TECHNIQUE: Contiguous axial images were obtained from the base of the skull through the vertex without intravenous contrast. RADIATION DOSE REDUCTION: This exam was performed according to the departmental dose-optimization program which includes automated exposure control, adjustment of the mA and/or kV according to patient size and/or use of iterative reconstruction technique. COMPARISON:  MRI of the hand January 15, 2011 FINDINGS: Brain: No evidence of acute infarction, hemorrhage, hydrocephalus, extra-axial collection or mass lesion/mass effect. Mild brain parenchymal volume loss. Vascular: No hyperdense vessel or unexpected calcification. Skull: Normal. Negative for fracture or focal lesion. Sinuses/Orbits: Left mastoid air cell effusion. Other: Multiple partially calcified soft tissue nodules in the scalp. IMPRESSION: 1. No acute intracranial abnormality. 2. Mild brain parenchymal volume loss. 3. Left mastoid air cell effusion. 4. Multiple partially calcified soft tissue  nodules in the scalp. Please correlate clinically. Electronically Signed   By: Fidela Salisbury M.D.   On: 07/03/2022 15:38   DG Chest 2 View  Result Date: 07/03/2022 CLINICAL DATA:  cp low blood pressure and dizziness EXAM: CHEST - 2 VIEW COMPARISON:  None Available. FINDINGS: The heart size and mediastinal contours are within normal limits and stable. Low lung volume. Both lungs are clear and stable. The visualized skeletal structures are unremarkable. IMPRESSION: No active cardiopulmonary disease. Electronically Signed   By: Frazier Richards M.D.   On: 07/03/2022 14:48    Procedures Procedures    Medications Ordered in ED Medications  potassium chloride SA (KLOR-CON M) CR tablet 40 mEq (40 mEq Oral Given 07/03/22 1759)    ED Course/ Medical Decision Making/ A&P                           Medical Decision Making Patient presenting for evaluation of low blood pressure with near syncope at home today.  No clear etiology, no recent illnesses.  ED evaluation required for evaluation of causes of hypotension.  Amount and/or Complexity of Data Reviewed Independent Historian:     Details: He is a cogent historian Labs: ordered.    Details: CBC, metabolic panel, magnesium, troponin -- normal except magnesium low, potassium low, glucose slightly elevated, creatinine high, calcium low, total protein low Radiology: ordered and independent interpretation performed.    Details: CT head-no acute intracranial abnormality.  Scalp nodules visualized.  Risk OTC drugs. Prescription drug management. Decision regarding hospitalization. Risk Details: Patient with hypotension and near syncope, likely multifactorial.  Symptoms started today without clear cause.  Patient found to be hypokalemic and hypomagnesemic.  Potassium supplementation started in the ED.  Doubt significant cardiovascular instability preventing discharge home.  Blood pressure improved after treatment in the ED.  Patient's wife states that  he has known dermatologic nodules of the scalp previously diagnosed by a dermatologist, "he has had all his life.".  Have advised him to hold his lisinopril/hydrochlorothiazide for 3 days, and half his dose of carvedilol to 6.25 mg twice daily.  Starting to follow-up with his PCP in a few days to make sure he is doing well and see if he needs additional medication modification.  Prescriptions written for potassium and magnesium.  Critical Care Total time providing critical care: 40 minutes           Final Clinical Impression(s) / ED Diagnoses Final diagnoses:  Near syncope  Hypotension, unspecified hypotension type  Hypokalemia  Hypomagnesemia    Rx / DC Orders ED Discharge Orders          Ordered    potassium chloride SA (KLOR-CON M) 20 MEQ tablet  2 times daily        07/03/22 1939    Magnesium Oxide -Mg Supplement 200 MG TABS  2 times daily        07/03/22 1939              Daleen Bo, MD 07/03/22 2243

## 2022-08-27 ENCOUNTER — Encounter: Payer: Self-pay | Admitting: Internal Medicine

## 2022-08-27 ENCOUNTER — Ambulatory Visit: Payer: Commercial Managed Care - HMO | Admitting: Internal Medicine

## 2022-08-27 VITALS — BP 120/69 | HR 66 | Temp 97.8°F | Resp 16 | Ht 76.0 in | Wt 323.0 lb

## 2022-08-27 DIAGNOSIS — I2510 Atherosclerotic heart disease of native coronary artery without angina pectoris: Secondary | ICD-10-CM

## 2022-08-27 DIAGNOSIS — I10 Essential (primary) hypertension: Secondary | ICD-10-CM | POA: Insufficient documentation

## 2022-08-27 DIAGNOSIS — E785 Hyperlipidemia, unspecified: Secondary | ICD-10-CM | POA: Insufficient documentation

## 2022-08-27 MED ORDER — ROSUVASTATIN CALCIUM 40 MG PO TABS: 40.0000 mg | ORAL_TABLET | Freq: Every day | ORAL | 2 refills | Status: DC

## 2022-08-27 MED ORDER — TADALAFIL 20 MG PO TABS: 20.0000 mg | ORAL_TABLET | Freq: Every day | ORAL | 0 refills | Status: AC | PRN
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
## 2022-09-16 ENCOUNTER — Other Ambulatory Visit: Payer: Commercial Managed Care - HMO

## 2022-10-01 ENCOUNTER — Other Ambulatory Visit: Payer: Commercial Managed Care - HMO

## 2022-11-21 ENCOUNTER — Other Ambulatory Visit: Payer: Self-pay | Admitting: Internal Medicine

## 2023-01-07 ENCOUNTER — Ambulatory Visit: Payer: Commercial Managed Care - HMO | Admitting: Podiatry

## 2023-01-07 DIAGNOSIS — L03031 Cellulitis of right toe: Secondary | ICD-10-CM

## 2023-01-07 DIAGNOSIS — I739 Peripheral vascular disease, unspecified: Secondary | ICD-10-CM

## 2023-01-07 DIAGNOSIS — M79674 Pain in right toe(s): Secondary | ICD-10-CM | POA: Diagnosis not present

## 2023-01-07 DIAGNOSIS — B351 Tinea unguium: Secondary | ICD-10-CM | POA: Diagnosis not present

## 2023-01-07 DIAGNOSIS — E1142 Type 2 diabetes mellitus with diabetic polyneuropathy: Secondary | ICD-10-CM

## 2023-01-07 DIAGNOSIS — M79675 Pain in left toe(s): Secondary | ICD-10-CM | POA: Diagnosis not present
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
## 2023-01-08 ENCOUNTER — Other Ambulatory Visit: Payer: Self-pay | Admitting: Cardiology

## 2023-01-08 DIAGNOSIS — I2510 Atherosclerotic heart disease of native coronary artery without angina pectoris: Secondary | ICD-10-CM

## 2023-01-08 DIAGNOSIS — I10 Essential (primary) hypertension: Secondary | ICD-10-CM

## 2023-01-14 ENCOUNTER — Ambulatory Visit: Payer: Commercial Managed Care - HMO | Attending: Cardiology

## 2023-02-10 ENCOUNTER — Ambulatory Visit: Payer: Commercial Managed Care - HMO | Attending: Podiatry

## 2023-02-10 DIAGNOSIS — I739 Peripheral vascular disease, unspecified: Secondary | ICD-10-CM | POA: Diagnosis not present

## 2023-02-10 DIAGNOSIS — E1142 Type 2 diabetes mellitus with diabetic polyneuropathy: Secondary | ICD-10-CM | POA: Diagnosis not present

## 2023-02-10 LAB — VAS US ABI WITH/WO TBI
Left ABI: 1.25
Right ABI: 1.16

## 2023-02-24 ENCOUNTER — Other Ambulatory Visit: Payer: Self-pay | Admitting: Internal Medicine

## 2023-02-25 ENCOUNTER — Ambulatory Visit: Payer: Commercial Managed Care - HMO | Admitting: Internal Medicine

## 2023-02-25 ENCOUNTER — Encounter: Payer: Self-pay | Admitting: Internal Medicine

## 2023-02-25 VITALS — BP 132/69 | HR 57 | Resp 16 | Ht 76.0 in | Wt 318.0 lb

## 2023-02-25 DIAGNOSIS — I10 Essential (primary) hypertension: Secondary | ICD-10-CM

## 2023-02-25 DIAGNOSIS — G4733 Obstructive sleep apnea (adult) (pediatric): Secondary | ICD-10-CM

## 2023-02-25 DIAGNOSIS — I2510 Atherosclerotic heart disease of native coronary artery without angina pectoris: Secondary | ICD-10-CM
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
## 2023-02-27 ENCOUNTER — Other Ambulatory Visit: Payer: Self-pay

## 2023-02-27 DIAGNOSIS — L98491 Non-pressure chronic ulcer of skin of other sites limited to breakdown of skin: Secondary | ICD-10-CM

## 2023-02-27 DIAGNOSIS — I739 Peripheral vascular disease, unspecified: Secondary | ICD-10-CM

## 2023-02-27 MED ORDER — CLOPIDOGREL BISULFATE 75 MG PO TABS: 75.0000 mg | ORAL_TABLET | Freq: Every day | ORAL | 2 refills | Status: DC

## 2023-04-07 ENCOUNTER — Ambulatory Visit (INDEPENDENT_AMBULATORY_CARE_PROVIDER_SITE_OTHER): Payer: Commercial Managed Care - HMO | Admitting: Podiatry

## 2023-04-07 DIAGNOSIS — I739 Peripheral vascular disease, unspecified: Secondary | ICD-10-CM

## 2023-04-07 DIAGNOSIS — B351 Tinea unguium: Secondary | ICD-10-CM | POA: Diagnosis not present

## 2023-04-07 DIAGNOSIS — E1142 Type 2 diabetes mellitus with diabetic polyneuropathy: Secondary | ICD-10-CM

## 2023-04-07 DIAGNOSIS — M79674 Pain in right toe(s): Secondary | ICD-10-CM | POA: Diagnosis not present

## 2023-04-07 DIAGNOSIS — M79675 Pain in left toe(s): Secondary | ICD-10-CM | POA: Diagnosis not present

## 2023-04-07 NOTE — Progress Notes (Signed)
  Subjective:  Patient ID: [name], male    DOB: 07-08-1956,  MRN: [id number]  Chief Complaint  Patient presents with   diabetic foot care     67 y.o. male presents with the above complaint. History confirmed with patient. Patient presenting with pain related to dystrophic thickened elongated nails. Patient is unable to trim own nails related to nail dystrophy and/or mobility issues. Patient does have a history of T2DM.   Objective:  Physical Exam: warm, good capillary refill nail exam onychomycosis of the toenails, onycholysis, and dystrophic nails DP pulses non palpable, PT pulses non palpable, and protective sensation absent Left Foot:  Pain with palpation of nails due to elongation and dystrophic growth.  Right Foot: Pain with palpation of nails due to elongation and dystrophic growth.   Assessment:   1. Pain due to onychomycosis of toenails of both feet   2. PVD (peripheral vascular disease)   3. DM type 2 with diabetic peripheral neuropathy      Plan:  Patient was evaluated and treated and all questions answered.  #Onychomycosis with pain  -Nails palliatively debrided as below. -Educated on self-care  Procedure: Nail Debridement Rationale: Pain Type of Debridement: manual, sharp debridement. Instrumentation: Nail nipper, rotary burr. Number of Nails: 10  Return in about 3 months (around 07/07/2023) for [facility].         Corinna Gab, DPM [facility] / [facility]

## 2023-06-01 ENCOUNTER — Other Ambulatory Visit: Payer: Self-pay | Admitting: Internal Medicine

## 2023-06-13 ENCOUNTER — Other Ambulatory Visit: Payer: Self-pay | Admitting: Cardiology

## 2023-06-28 ENCOUNTER — Other Ambulatory Visit: Payer: Self-pay | Admitting: Cardiology

## 2023-06-28 DIAGNOSIS — I2510 Atherosclerotic heart disease of native coronary artery without angina pectoris: Secondary | ICD-10-CM

## 2023-06-28 DIAGNOSIS — I10 Essential (primary) hypertension: Secondary | ICD-10-CM

## 2023-07-07 ENCOUNTER — Ambulatory Visit: Payer: Medicare Other | Admitting: Podiatry

## 2023-07-07 DIAGNOSIS — I739 Peripheral vascular disease, unspecified: Secondary | ICD-10-CM

## 2023-07-07 DIAGNOSIS — B351 Tinea unguium: Secondary | ICD-10-CM

## 2023-07-07 DIAGNOSIS — M79675 Pain in left toe(s): Secondary | ICD-10-CM

## 2023-07-07 DIAGNOSIS — M79674 Pain in right toe(s): Secondary | ICD-10-CM | POA: Diagnosis not present

## 2023-07-07 DIAGNOSIS — E1142 Type 2 diabetes mellitus with diabetic polyneuropathy: Secondary | ICD-10-CM

## 2023-07-07 NOTE — Progress Notes (Signed)
  Subjective:  Patient ID: [name], male    DOB: 1956/04/17,  MRN: [id number]  Chief Complaint  Patient presents with   Nail Problem    Diabetic Foot Care-nail trim      67 y.o. male presents with the above complaint. History confirmed with patient. Patient presenting with pain related to dystrophic thickened elongated nails. Patient is unable to trim own nails related to nail dystrophy and/or mobility issues. Patient does have a history of T2DM.   Objective:  Physical Exam: warm, good capillary refill nail exam onychomycosis of the toenails, onycholysis, and dystrophic nails DP pulses non palpable, PT pulses non palpable, and protective sensation absent Left Foot:  Pain with palpation of nails due to elongation and dystrophic growth.  Right Foot: Pain with palpation of nails due to elongation and dystrophic growth.   Assessment:   1. Pain due to onychomycosis of toenails of both feet   2. PVD (peripheral vascular disease) (HCC)   3. DM type 2 with diabetic peripheral neuropathy (HCC)       Plan:  Patient was evaluated and treated and all questions answered.  #Onychomycosis with pain  -Nails palliatively debrided as below. -Educated on self-care  Procedure: Nail Debridement Rationale: Pain Type of Debridement: manual, sharp debridement. Instrumentation: Nail nipper, rotary burr. Number of Nails: 10  Return in about 3 months (around 10/07/2023) for [facility].         Corinna Gab, DPM [facility] / [facility]

## 2023-07-12 DIAGNOSIS — D225 Melanocytic nevi of trunk: Secondary | ICD-10-CM | POA: Diagnosis not present

## 2023-07-12 DIAGNOSIS — L02219 Cutaneous abscess of trunk, unspecified: Secondary | ICD-10-CM | POA: Diagnosis not present

## 2023-07-12 DIAGNOSIS — L570 Actinic keratosis: Secondary | ICD-10-CM | POA: Diagnosis not present

## 2023-07-12 DIAGNOSIS — L820 Inflamed seborrheic keratosis: Secondary | ICD-10-CM | POA: Diagnosis not present

## 2023-07-12 DIAGNOSIS — D2239 Melanocytic nevi of other parts of face: Secondary | ICD-10-CM | POA: Diagnosis not present

## 2023-07-12 DIAGNOSIS — D485 Neoplasm of uncertain behavior of skin: Secondary | ICD-10-CM | POA: Diagnosis not present

## 2023-08-19 DIAGNOSIS — I10 Essential (primary) hypertension: Secondary | ICD-10-CM | POA: Diagnosis not present

## 2023-08-19 DIAGNOSIS — E119 Type 2 diabetes mellitus without complications: Secondary | ICD-10-CM | POA: Diagnosis not present

## 2023-08-19 DIAGNOSIS — E7800 Pure hypercholesterolemia, unspecified: Secondary | ICD-10-CM | POA: Diagnosis not present

## 2023-08-19 DIAGNOSIS — R609 Edema, unspecified: Secondary | ICD-10-CM | POA: Diagnosis not present

## 2023-08-19 DIAGNOSIS — I2510 Atherosclerotic heart disease of native coronary artery without angina pectoris: Secondary | ICD-10-CM | POA: Diagnosis not present

## 2023-08-19 DIAGNOSIS — Z9181 History of falling: Secondary | ICD-10-CM | POA: Diagnosis not present

## 2023-08-29 ENCOUNTER — Ambulatory Visit: Payer: Medicare Other | Admitting: Internal Medicine

## 2023-08-31 ENCOUNTER — Other Ambulatory Visit: Payer: Self-pay | Admitting: Cardiology

## 2023-09-01 ENCOUNTER — Encounter: Payer: Self-pay | Admitting: Cardiology

## 2023-09-01 ENCOUNTER — Ambulatory Visit: Payer: Medicare Other | Admitting: Cardiology

## 2023-09-01 VITALS — BP 120/70 | HR 62 | Resp 12 | Ht 76.0 in | Wt 320.6 lb

## 2023-09-01 DIAGNOSIS — I2510 Atherosclerotic heart disease of native coronary artery without angina pectoris: Secondary | ICD-10-CM | POA: Diagnosis not present

## 2023-09-01 DIAGNOSIS — E7800 Pure hypercholesterolemia, unspecified: Secondary | ICD-10-CM | POA: Diagnosis not present

## 2023-09-01 DIAGNOSIS — I10 Essential (primary) hypertension: Secondary | ICD-10-CM | POA: Diagnosis not present

## 2023-09-01 MED ORDER — ROSUVASTATIN CALCIUM 40 MG PO TABS: 40.0000 mg | ORAL_TABLET | Freq: Every day | ORAL | 3 refills | Status: DC
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
## 2023-09-01 NOTE — Telephone Encounter (Signed)
Patient has an appointment today

## 2023-09-30 DIAGNOSIS — R600 Localized edema: Secondary | ICD-10-CM | POA: Diagnosis not present

## 2023-09-30 DIAGNOSIS — S82431A Displaced oblique fracture of shaft of right fibula, initial encounter for closed fracture: Secondary | ICD-10-CM | POA: Diagnosis not present

## 2023-09-30 DIAGNOSIS — I10 Essential (primary) hypertension: Secondary | ICD-10-CM | POA: Diagnosis not present

## 2023-09-30 DIAGNOSIS — E876 Hypokalemia: Secondary | ICD-10-CM | POA: Diagnosis not present

## 2023-09-30 DIAGNOSIS — E860 Dehydration: Secondary | ICD-10-CM | POA: Diagnosis not present

## 2023-09-30 DIAGNOSIS — S82831A Other fracture of upper and lower end of right fibula, initial encounter for closed fracture: Secondary | ICD-10-CM | POA: Diagnosis not present

## 2023-09-30 DIAGNOSIS — I2510 Atherosclerotic heart disease of native coronary artery without angina pectoris: Secondary | ICD-10-CM | POA: Diagnosis not present

## 2023-09-30 DIAGNOSIS — M25579 Pain in unspecified ankle and joints of unspecified foot: Secondary | ICD-10-CM | POA: Diagnosis not present

## 2023-09-30 DIAGNOSIS — E119 Type 2 diabetes mellitus without complications: Secondary | ICD-10-CM | POA: Diagnosis not present

## 2023-10-06 ENCOUNTER — Ambulatory Visit: Payer: Medicare Other | Admitting: Podiatry

## 2023-10-06 ENCOUNTER — Encounter: Payer: Self-pay | Admitting: Podiatry

## 2023-10-06 ENCOUNTER — Ambulatory Visit (INDEPENDENT_AMBULATORY_CARE_PROVIDER_SITE_OTHER): Payer: Medicare Other

## 2023-10-06 DIAGNOSIS — B351 Tinea unguium: Secondary | ICD-10-CM | POA: Diagnosis not present

## 2023-10-06 DIAGNOSIS — M79674 Pain in right toe(s): Secondary | ICD-10-CM

## 2023-10-06 DIAGNOSIS — M79675 Pain in left toe(s): Secondary | ICD-10-CM

## 2023-10-06 DIAGNOSIS — S8264XA Nondisplaced fracture of lateral malleolus of right fibula, initial encounter for closed fracture: Secondary | ICD-10-CM | POA: Diagnosis not present

## 2023-10-06 DIAGNOSIS — S91209A Unspecified open wound of unspecified toe(s) with damage to nail, initial encounter: Secondary | ICD-10-CM | POA: Diagnosis not present

## 2023-10-06 DIAGNOSIS — I739 Peripheral vascular disease, unspecified: Secondary | ICD-10-CM

## 2023-10-06 DIAGNOSIS — E1142 Type 2 diabetes mellitus with diabetic polyneuropathy: Secondary | ICD-10-CM | POA: Diagnosis not present

## 2023-10-06 MED ORDER — MUPIROCIN 2 % EX OINT: 1.0000 | TOPICAL_OINTMENT | Freq: Every day | CUTANEOUS | 0 refills | Status: DC

## 2023-10-06 MED ORDER — TRAMADOL HCL 50 MG PO TABS
50.0000 mg | ORAL_TABLET | Freq: Three times a day (TID) | ORAL | 0 refills | Status: AC | PRN
Start: 2023-10-06 — End: 2023-10-16

## 2023-10-06 NOTE — Progress Notes (Signed)
Subjective:  Patient ID: [name], male    DOB: 1956-04-14,  MRN: [id number]  Chief Complaint  Patient presents with   Fracture    Right fib fracture sustained about 2 weeks ago, presented to [facility]. Referral placed to ortho. Reports walking around the house without boot. Boot causing irritation, superficial wound to the right 2nd toe    67 y.o. male presents with the above complaint. History confirmed with patient. Patient presenting with pain related to dystrophic thickened elongated nails. Patient is unable to trim own nails related to nail dystrophy and/or mobility issues. Patient does have a history of T2DM.  Patient also reports that he sustained a right ankle fracture about 2 weeks ago he went to [facility].  Was referred to Ortho.  Reports walking around the house with boot.  Boot caused injury to the second toe where the nail lifted off on the right foot.  Objective:  Physical Exam: warm, good capillary refill nail exam onychomycosis of the toenails, onycholysis, and dystrophic nails DP pulses non palpable, PT pulses non palpable, and protective sensation absent Left Foot:  Pain with palpation of nails due to elongation and dystrophic growth.  Right Foot: Pain with palpation of nails due to elongation and dystrophic growth.  Partial avulsion of the second toenail which is very loose.  Underlying nailbed is relatively healthy with no significant ulceration.  There is some mild maceration but no erythema or drainage or deep wound.  Right ankle with edema noted to the right ankle laterally pain on palpation along the fibula.  Assessment:   1. Closed nondisplaced fracture of lateral malleolus of right fibula, initial encounter   2. Avulsion of toenail, initial encounter   3. Pain due to onychomycosis of toenails of both feet   4. PVD (peripheral vascular disease) (HCC)   5. DM type 2 with diabetic peripheral neuropathy (HCC)      Plan:  Patient was  evaluated and treated and all questions answered.  # Distal fibular fracture right with minimal to no displacement -Recommend immobilization nonweightbearing in cam boot for next 6 to 8 weeks. -Advised nonweightbearing with crutches or knee scooter -Patient has cam boot recommend he remove the toe guard to prevent rubbing on the second toe as it heals -Tramadol for pain  # Lysis of second toenail -Removed second toenail in total at this visit after prep with Betadine then dressed with antibiotic ointment and Band-Aid -eRx for mupirocin ointment apply once daily to the second toenail bed and apply Band-Aid overlying  #Onychomycosis with pain  -Nails palliatively debrided as below. -Educated on self-care  Procedure: Nail Debridement Rationale: Pain Type of Debridement: manual, sharp debridement. Instrumentation: Nail nipper, rotary burr. Number of Nails: 10  Return in about 6 weeks (around 11/17/2023) for Follow-up right ankle fracture.         Corinna Gab, DPM [facility]

## 2023-10-08 DIAGNOSIS — R55 Syncope and collapse: Secondary | ICD-10-CM | POA: Diagnosis not present

## 2023-10-08 DIAGNOSIS — E11649 Type 2 diabetes mellitus with hypoglycemia without coma: Secondary | ICD-10-CM | POA: Diagnosis not present

## 2023-10-08 DIAGNOSIS — E860 Dehydration: Secondary | ICD-10-CM | POA: Diagnosis not present

## 2023-10-08 DIAGNOSIS — Z23 Encounter for immunization: Secondary | ICD-10-CM | POA: Diagnosis not present

## 2023-10-08 DIAGNOSIS — I959 Hypotension, unspecified: Secondary | ICD-10-CM | POA: Diagnosis not present

## 2023-10-08 DIAGNOSIS — S82831A Other fracture of upper and lower end of right fibula, initial encounter for closed fracture: Secondary | ICD-10-CM | POA: Diagnosis not present

## 2023-10-08 DIAGNOSIS — E876 Hypokalemia: Secondary | ICD-10-CM | POA: Diagnosis not present

## 2023-10-08 DIAGNOSIS — G4733 Obstructive sleep apnea (adult) (pediatric): Secondary | ICD-10-CM | POA: Diagnosis not present

## 2023-10-15 DIAGNOSIS — I959 Hypotension, unspecified: Secondary | ICD-10-CM | POA: Diagnosis not present

## 2023-10-15 DIAGNOSIS — N289 Disorder of kidney and ureter, unspecified: Secondary | ICD-10-CM | POA: Diagnosis not present

## 2023-10-15 DIAGNOSIS — E119 Type 2 diabetes mellitus without complications: Secondary | ICD-10-CM | POA: Diagnosis not present

## 2023-10-15 DIAGNOSIS — G4733 Obstructive sleep apnea (adult) (pediatric): Secondary | ICD-10-CM | POA: Diagnosis not present

## 2023-10-15 DIAGNOSIS — E876 Hypokalemia: Secondary | ICD-10-CM | POA: Diagnosis not present

## 2023-11-17 ENCOUNTER — Encounter (HOSPITAL_BASED_OUTPATIENT_CLINIC_OR_DEPARTMENT_OTHER): Payer: Self-pay

## 2023-11-17 ENCOUNTER — Ambulatory Visit (HOSPITAL_BASED_OUTPATIENT_CLINIC_OR_DEPARTMENT_OTHER)
Admission: RE | Admit: 2023-11-17 | Discharge: 2023-11-17 | Disposition: A | Discharge status: Home / Self Care | Payer: Medicare Other | Source: Ambulatory Visit | Attending: Internal Medicine | Admitting: Internal Medicine

## 2023-11-17 VITALS — BP 135/81 | HR 84 | Temp 98.1°F | Resp 20

## 2023-11-17 DIAGNOSIS — R112 Nausea with vomiting, unspecified: Secondary | ICD-10-CM | POA: Diagnosis not present

## 2023-11-17 NOTE — Discharge Instructions (Signed)
Stop the CoQ10 until you see your primary care provider

## 2023-11-17 NOTE — ED Triage Notes (Signed)
Pt c/o vomiting x1 10 days ago, once 3 days ago, and last night. Pt brought in a sample of his vomit and it appears of a gel pill with clear in color, and states it a whole lot of them with water.
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
## 2023-11-18 ENCOUNTER — Ambulatory Visit: Payer: Medicare Other | Admitting: Podiatry

## 2023-11-18 ENCOUNTER — Ambulatory Visit (INDEPENDENT_AMBULATORY_CARE_PROVIDER_SITE_OTHER): Payer: Medicare Other

## 2023-11-18 DIAGNOSIS — E1142 Type 2 diabetes mellitus with diabetic polyneuropathy: Secondary | ICD-10-CM

## 2023-11-18 DIAGNOSIS — M79674 Pain in right toe(s): Secondary | ICD-10-CM | POA: Diagnosis not present

## 2023-11-18 DIAGNOSIS — B351 Tinea unguium: Secondary | ICD-10-CM

## 2023-11-18 DIAGNOSIS — M778 Other enthesopathies, not elsewhere classified: Secondary | ICD-10-CM

## 2023-11-18 DIAGNOSIS — I739 Peripheral vascular disease, unspecified: Secondary | ICD-10-CM

## 2023-11-18 DIAGNOSIS — S8264XG Nondisplaced fracture of lateral malleolus of right fibula, subsequent encounter for closed fracture with delayed healing: Secondary | ICD-10-CM

## 2023-11-18 DIAGNOSIS — M79675 Pain in left toe(s): Secondary | ICD-10-CM

## 2023-11-19 NOTE — Progress Notes (Signed)
  Subjective:  Patient ID: [name], male    DOB: 22-Jul-1956,  MRN: [id number]  Chief Complaint  Patient presents with   Fracture    Rt foot. Doing well since last visit. Very little pain. Tried to sleep w/o boot but that did not go well He does wear the boot 24/7.    Foot Care    Would also like to have [facility]. Last A1c: 6.8. Takes Plavix for anticoag. Rt 2nd hallux has improved. Mupirocin oint did help but he is no longer using it.      67 y.o. male presents with the above complaint. History confirmed with patient. Patient does have a history of T2DM.  Patient is approximately 6 weeks out from right fibular fracture.  He has been compliant with use of the cam boot.  He states that he has not been ambulating very much.  Objective:  Physical Exam: warm, good capillary refill nail exam onychomycosis of the toenails, onycholysis, and dystrophic nails DP pulses non palpable, PT pulses non palpable, and protective sensation absent Right second nailbed at the prior avulsion site appears stable, well-healed.   Right ankle with minimal edema noted to the right ankle laterally no pain on palpation along the fibula.  Radiographs: 11/18/2023 -Fracture line still visible to distal fibula.  No displacement appreciated.  Little bone callus appreciated Assessment:   1. Closed nondisplaced fracture of lateral malleolus of right fibula with delayed healing, subsequent encounter   2. PVD (peripheral vascular disease) (HCC)   3. DM type 2 with diabetic peripheral neuropathy (HCC)   4. Pain due to onychomycosis of toenails of both feet      Plan:  Patient was evaluated and treated and all questions answered.  # Distal fibular fracture right with minimal displacement, delayed healing -Recommend immobilization in cam boot for another 4 weeks. -Advised patient to begin some protected ambulation, this may promote some bone callus and micromotion of the fracture site -He may continue without the  toe guard as previously discussed -Pain well-controlled.  May ice, elevate, apply compression as needed -Follow-up in 1 month for serial ankle x-rays, may consider bone stimulator at this time if little evidence of fracture healing is seen.    Return in about 4 weeks (around 12/16/2023) for Right fibular fracture.         Barbaraann Share, DPM [facility] / [facility]

## 2023-11-24 DIAGNOSIS — R609 Edema, unspecified: Secondary | ICD-10-CM | POA: Diagnosis not present

## 2023-11-24 DIAGNOSIS — N289 Disorder of kidney and ureter, unspecified: Secondary | ICD-10-CM | POA: Diagnosis not present

## 2023-11-24 DIAGNOSIS — I2510 Atherosclerotic heart disease of native coronary artery without angina pectoris: Secondary | ICD-10-CM | POA: Diagnosis not present

## 2023-11-24 DIAGNOSIS — R1110 Vomiting, unspecified: Secondary | ICD-10-CM | POA: Diagnosis not present

## 2023-11-24 DIAGNOSIS — I10 Essential (primary) hypertension: Secondary | ICD-10-CM | POA: Diagnosis not present

## 2023-11-24 DIAGNOSIS — G4733 Obstructive sleep apnea (adult) (pediatric): Secondary | ICD-10-CM | POA: Diagnosis not present

## 2023-11-24 DIAGNOSIS — E1140 Type 2 diabetes mellitus with diabetic neuropathy, unspecified: Secondary | ICD-10-CM | POA: Diagnosis not present

## 2023-11-24 DIAGNOSIS — E119 Type 2 diabetes mellitus without complications: Secondary | ICD-10-CM | POA: Diagnosis not present

## 2023-11-24 DIAGNOSIS — E7800 Pure hypercholesterolemia, unspecified: Secondary | ICD-10-CM | POA: Diagnosis not present

## 2023-11-25 DIAGNOSIS — G4733 Obstructive sleep apnea (adult) (pediatric): Secondary | ICD-10-CM | POA: Diagnosis not present

## 2023-12-01 ENCOUNTER — Other Ambulatory Visit: Payer: Self-pay | Admitting: Internal Medicine

## 2023-12-01 DIAGNOSIS — I739 Peripheral vascular disease, unspecified: Secondary | ICD-10-CM

## 2023-12-01 DIAGNOSIS — L98491 Non-pressure chronic ulcer of skin of other sites limited to breakdown of skin: Secondary | ICD-10-CM

## 2023-12-12 ENCOUNTER — Other Ambulatory Visit: Payer: Self-pay

## 2023-12-12 DIAGNOSIS — L98491 Non-pressure chronic ulcer of skin of other sites limited to breakdown of skin: Secondary | ICD-10-CM

## 2023-12-12 DIAGNOSIS — I739 Peripheral vascular disease, unspecified: Secondary | ICD-10-CM

## 2023-12-12 MED ORDER — CLOPIDOGREL BISULFATE 75 MG PO TABS: 75.0000 mg | ORAL_TABLET | Freq: Every day | ORAL | 2 refills | Status: DC

## 2023-12-19 DIAGNOSIS — G4733 Obstructive sleep apnea (adult) (pediatric): Secondary | ICD-10-CM | POA: Diagnosis not present

## 2023-12-22 ENCOUNTER — Ambulatory Visit: Payer: Medicare Other | Admitting: Podiatry

## 2023-12-26 DIAGNOSIS — G4733 Obstructive sleep apnea (adult) (pediatric): Secondary | ICD-10-CM | POA: Diagnosis not present

## 2023-12-29 ENCOUNTER — Ambulatory Visit (INDEPENDENT_AMBULATORY_CARE_PROVIDER_SITE_OTHER): Payer: Medicare Other

## 2023-12-29 ENCOUNTER — Ambulatory Visit: Payer: Medicare Other | Admitting: Podiatry

## 2023-12-29 ENCOUNTER — Other Ambulatory Visit: Payer: Self-pay

## 2023-12-29 DIAGNOSIS — M7751 Other enthesopathy of right foot: Secondary | ICD-10-CM | POA: Diagnosis not present

## 2023-12-29 DIAGNOSIS — M79675 Pain in left toe(s): Secondary | ICD-10-CM

## 2023-12-29 DIAGNOSIS — B351 Tinea unguium: Secondary | ICD-10-CM

## 2023-12-29 DIAGNOSIS — E1142 Type 2 diabetes mellitus with diabetic polyneuropathy: Secondary | ICD-10-CM | POA: Diagnosis not present

## 2023-12-29 DIAGNOSIS — S8264XG Nondisplaced fracture of lateral malleolus of right fibula, subsequent encounter for closed fracture with delayed healing: Secondary | ICD-10-CM | POA: Diagnosis not present

## 2023-12-29 DIAGNOSIS — I739 Peripheral vascular disease, unspecified: Secondary | ICD-10-CM | POA: Diagnosis not present

## 2023-12-29 DIAGNOSIS — M79674 Pain in right toe(s): Secondary | ICD-10-CM

## 2023-12-29 NOTE — Progress Notes (Signed)
  Subjective:  Patient ID: [name], male    DOB: [birth year],  MRN: [id number]  Chief Complaint  Patient presents with   Fracture    [facility] and Rechecking Rt fib Fx. Pt states he is doing well, still has some weakness, but doing ok overall. A1c 6.3, Asa 67    68 y.o. male presents with the above complaint. History confirmed with patient. Patient does have a history of T2DM.  Patient is following up for right fibular fracture sustained beginning of October of 2024.  He does state that over the past couple weeks he has been walking without the use of the cam boot, despite strict instructions to remain in the cam boot whenever out of bed.  He is also requesting nail trim today for painful, elongated, dystrophic toenails that he is trouble maintaining himself due to their thickened morphology. Last A1c 6.3  Objective:  Physical Exam: warm, good capillary refill nail exam onychomycosis of the toenails, onycholysis, and dystrophic nails DP pulses non palpable, PT pulses non palpable, and protective sensation absent. Vibratory sensation absent Right second nailbed at the prior avulsion site appears stable, well-healed.  Palpation of the nail plates 1 through 5 bilaterally  Right ankle with minimal edema noted to the right ankle laterally no pain on palpation along the fibula.  Radiographs: 12/29/23 -Fracture line is present through the distal fibula oblique fracture present. There is some increased displacement from previous, though this does appear mild. There is some callus formation however fracture line clearly visible.  Some concern for mild fibular shortening and some widening of the syndesmosis. Assessment:   1. Closed nondisplaced fracture of lateral malleolus of right fibula with delayed healing, subsequent encounter   2. DM type 2 with diabetic peripheral neuropathy (HCC)   3. PVD (peripheral vascular disease) (HCC)   4. Pain due to onychomycosis of toenails of both feet       Plan:  Patient was evaluated and treated and all questions answered.  # Distal fibular fracture right with minimal displacement, delayed healing -Findings and treatment plan discussed with the patient -He has had delayed healing at this point -Strict cam boot immobilization -Ordering vitamin D , calcium , levels for the patient.  Will supplement as appropriate. -Concerned about his neurovascular status as well.  Ordering ABIs, did have studies little over 1 year ago. His neuropathy may be a challenge to healing as well -Ordering CT scan to assess for any evidence of union, this may determine need for surgery or bone stimulator.  Will have patient follow-up in 2 weeks to discuss further.  -I certify that this diagnosis represents a distinct and separate diagnosis that requires evaluation and treatment separate from other procedures or diagnosis  # Painful onychomycosis - Nails 1-5 bilaterally were debrided in thickness and length using sterile nail nippers without incident - Mechanical bur used to file nails thin.    Return in about 2 weeks (around 01/12/2024) for Fracture check.         Ethan LITTIE Saddler, DPM [facility] / [facility]

## 2023-12-31 DIAGNOSIS — S8264XG Nondisplaced fracture of lateral malleolus of right fibula, subsequent encounter for closed fracture with delayed healing: Secondary | ICD-10-CM | POA: Diagnosis not present

## 2024-01-01 LAB — CALCIUM: Calcium: 9.6 mg/dL (ref 8.6–10.2)

## 2024-01-01 LAB — VITAMIN D 25 HYDROXY (VIT D DEFICIENCY, FRACTURES): Vit D, 25-Hydroxy: 47 ng/mL (ref 30.0–100.0)

## 2024-01-05 ENCOUNTER — Other Ambulatory Visit: Payer: Self-pay | Admitting: Cardiology

## 2024-01-05 DIAGNOSIS — I10 Essential (primary) hypertension: Secondary | ICD-10-CM

## 2024-01-05 DIAGNOSIS — I2510 Atherosclerotic heart disease of native coronary artery without angina pectoris: Secondary | ICD-10-CM

## 2024-01-12 ENCOUNTER — Ambulatory Visit: Payer: Medicare Other | Admitting: Podiatry

## 2024-01-13 ENCOUNTER — Ambulatory Visit (HOSPITAL_BASED_OUTPATIENT_CLINIC_OR_DEPARTMENT_OTHER)
Admission: RE | Admit: 2024-01-13 | Discharge: 2024-01-13 | Disposition: A | Discharge status: Home / Self Care | Payer: Medicare Other | Source: Ambulatory Visit | Attending: Podiatry | Admitting: Radiology

## 2024-01-13 DIAGNOSIS — S8264XG Nondisplaced fracture of lateral malleolus of right fibula, subsequent encounter for closed fracture with delayed healing: Secondary | ICD-10-CM

## 2024-01-15 DIAGNOSIS — G4733 Obstructive sleep apnea (adult) (pediatric): Secondary | ICD-10-CM | POA: Diagnosis not present

## 2024-01-16 DIAGNOSIS — D2239 Melanocytic nevi of other parts of face: Secondary | ICD-10-CM | POA: Diagnosis not present

## 2024-01-16 DIAGNOSIS — L570 Actinic keratosis: Secondary | ICD-10-CM | POA: Diagnosis not present

## 2024-01-16 DIAGNOSIS — D225 Melanocytic nevi of trunk: Secondary | ICD-10-CM | POA: Diagnosis not present

## 2024-01-16 DIAGNOSIS — Z85820 Personal history of malignant melanoma of skin: Secondary | ICD-10-CM | POA: Diagnosis not present

## 2024-01-16 DIAGNOSIS — D485 Neoplasm of uncertain behavior of skin: Secondary | ICD-10-CM | POA: Diagnosis not present

## 2024-01-20 ENCOUNTER — Ambulatory Visit: Payer: Medicare Other | Attending: Podiatry

## 2024-01-20 DIAGNOSIS — I739 Peripheral vascular disease, unspecified: Secondary | ICD-10-CM

## 2024-01-26 DIAGNOSIS — G4733 Obstructive sleep apnea (adult) (pediatric): Secondary | ICD-10-CM | POA: Diagnosis not present

## 2024-01-27 ENCOUNTER — Encounter: Payer: Self-pay | Admitting: Podiatry

## 2024-01-27 ENCOUNTER — Ambulatory Visit (INDEPENDENT_AMBULATORY_CARE_PROVIDER_SITE_OTHER): Payer: Medicare Other | Admitting: Podiatry

## 2024-01-27 ENCOUNTER — Ambulatory Visit (INDEPENDENT_AMBULATORY_CARE_PROVIDER_SITE_OTHER): Payer: Medicare Other

## 2024-01-27 DIAGNOSIS — I739 Peripheral vascular disease, unspecified: Secondary | ICD-10-CM | POA: Diagnosis not present

## 2024-01-27 DIAGNOSIS — E1142 Type 2 diabetes mellitus with diabetic polyneuropathy: Secondary | ICD-10-CM

## 2024-01-27 DIAGNOSIS — S8264XG Nondisplaced fracture of lateral malleolus of right fibula, subsequent encounter for closed fracture with delayed healing: Secondary | ICD-10-CM | POA: Diagnosis not present

## 2024-01-27 NOTE — Progress Notes (Signed)
  Subjective:  Patient ID: [name], male    DOB: Jun 21, 1956,  MRN: [id number]  Chief Complaint  Patient presents with   Fracture    FX recheck right ankle, puffy and red. Last A1c 6.3 3 months ago, sees PCP tomorrow. Takes ASA 63    68 y.o. male presents with the above complaint. History confirmed with patient. Patient does have a history of T2DM.  Patient is approximately 17 weeks out from right fibular fracture.  He has been compliant with use of the cam boot.  He states that he has not been ambulating very much.  His lab work demonstrated vitamin D  and calcium  levels within normal limits.  ABIs did show noncompressible arteries.  He did not have CT scan which demonstrated clearly visible fracture line of the right fibula without significant callus formation.  Upon further discussion with the patient, he does reveal that he does use chewing tobacco.  Denies significant pain.  Does have some swelling to the right lower extremity.  Last A1c from 3 months ago was 6.3.  Objective:  Physical Exam: warm, good capillary refill nail exam onychomycosis of the toenails, onycholysis, and dystrophic nails DP pulses non palpable, PT pulses non palpable, and protective sensation absent Right second nailbed at the prior avulsion site appears stable, well-healed.   Right ankle with moderate edema noted to the right ankle laterally no pain on palpation along the fibula.  Radiographs: 01/27/2024 -Fracture line still visible to distal fibula.  No displacement appreciated.  Little bone callus appreciated.  Fracture gap approximately 4.5 mm  I personally reviewed the CT scan from 01/13/2024 of the right ankle demonstrating oblique fracture of distal fibula with mild comminution.  There is no significant callus formation or bridging noted at the primary fracture line. Assessment:   1. Closed nondisplaced fracture of lateral malleolus of right fibula with delayed healing, subsequent encounter   2. DM type  2 with diabetic peripheral neuropathy (HCC)   3. PVD (peripheral vascular disease) (HCC)      Plan:  Patient was evaluated and treated and all questions answered.  # Distal fibular fracture right with minimal displacement, delayed healing -Radiographs and CT scan discussed with patient. -Lab work reviewed with patient -Continue weightbearing as tolerated in cam boot -Discussed concern for developing nonunion.  We will try to get patient a bone stimulator. -Reviewed ABIs with patient, we will refer him to a vascular specialist and determine if any intervention is needed as this could also be playing a role in impaired fracture healing. -Discussed importance of nicotine and tobacco cessation.  Patient does report using chewing tobacco.  Possible this could have also played a role in the poor bone healing. -Will have patient follow-up in 1 month, see if we have any progress on bone stimulator, possible interventions with vascular.    Return in about 4 weeks (around 02/24/2024) for Fracture check.         Ethan LITTIE Saddler, DPM [facility] / [facility]

## 2024-01-28 DIAGNOSIS — I10 Essential (primary) hypertension: Secondary | ICD-10-CM | POA: Diagnosis not present

## 2024-01-28 DIAGNOSIS — R609 Edema, unspecified: Secondary | ICD-10-CM | POA: Diagnosis not present

## 2024-01-28 DIAGNOSIS — N289 Disorder of kidney and ureter, unspecified: Secondary | ICD-10-CM | POA: Diagnosis not present

## 2024-01-28 DIAGNOSIS — E119 Type 2 diabetes mellitus without complications: Secondary | ICD-10-CM | POA: Diagnosis not present

## 2024-01-28 DIAGNOSIS — G4733 Obstructive sleep apnea (adult) (pediatric): Secondary | ICD-10-CM | POA: Diagnosis not present

## 2024-01-28 DIAGNOSIS — E7800 Pure hypercholesterolemia, unspecified: Secondary | ICD-10-CM | POA: Diagnosis not present

## 2024-01-28 DIAGNOSIS — I2510 Atherosclerotic heart disease of native coronary artery without angina pectoris: Secondary | ICD-10-CM | POA: Diagnosis not present

## 2024-02-13 DIAGNOSIS — G4733 Obstructive sleep apnea (adult) (pediatric): Secondary | ICD-10-CM | POA: Diagnosis not present

## 2024-02-18 ENCOUNTER — Ambulatory Visit: Payer: Medicare Other | Admitting: Vascular Surgery

## 2024-02-18 ENCOUNTER — Encounter: Payer: Self-pay | Admitting: Vascular Surgery

## 2024-02-18 VITALS — BP 142/86 | HR 46 | Temp 97.9°F | Resp 20 | Ht 76.0 in | Wt 306.0 lb

## 2024-02-18 DIAGNOSIS — E0842 Diabetes mellitus due to underlying condition with diabetic polyneuropathy: Secondary | ICD-10-CM

## 2024-02-18 DIAGNOSIS — M25561 Pain in right knee: Secondary | ICD-10-CM
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
## 2024-02-23 DIAGNOSIS — G4733 Obstructive sleep apnea (adult) (pediatric): Secondary | ICD-10-CM | POA: Diagnosis not present

## 2024-02-24 ENCOUNTER — Ambulatory Visit (INDEPENDENT_AMBULATORY_CARE_PROVIDER_SITE_OTHER)

## 2024-02-24 ENCOUNTER — Encounter: Payer: Self-pay | Admitting: Podiatry

## 2024-02-24 ENCOUNTER — Ambulatory Visit (INDEPENDENT_AMBULATORY_CARE_PROVIDER_SITE_OTHER): Payer: Medicare Other | Admitting: Podiatry

## 2024-02-24 DIAGNOSIS — E1142 Type 2 diabetes mellitus with diabetic polyneuropathy: Secondary | ICD-10-CM | POA: Diagnosis not present

## 2024-02-24 DIAGNOSIS — S8264XG Nondisplaced fracture of lateral malleolus of right fibula, subsequent encounter for closed fracture with delayed healing: Secondary | ICD-10-CM

## 2024-02-24 NOTE — Patient Instructions (Signed)
Look for an "EvenUp" shoe attachment on [employer] or at [employer]. This will level out your hips while you are walking in the CAM boot. Wear this on the other foot around a supportive sneaker:     

## 2024-02-24 NOTE — Progress Notes (Signed)
  Subjective:  Patient ID: [name], male    DOB: 25-Feb-1956,  MRN: [id number]  Chief Complaint  Patient presents with   Fracture    Fracture follow up. Xrays up, Still having pain while up on it, in boot. He did ot wear boot into office today. He has little to no pain when at rest. Last A1c 6.5 in Feb. Takes ASA 81 and Plavix    68 y.o. male presents with the above complaint. History confirmed with patient. Patient does have a history of T2DM.  He does present ambulating wearing regular shoes, using cane, states that his boot is in the car.  Does not complain of significant ankle pain today.  We are currently awaiting bone stimulator at this point.  Objective:  Physical Exam: warm, good capillary refill nail exam onychomycosis of the toenails, onycholysis, and dystrophic nails DP pulses non palpable, PT pulses non palpable, and protective sensation absent Right second nailbed at the prior avulsion site appears stable, well-healed.   Right ankle with moderate edema noted to the right ankle laterally no pain on palpation along the fibula.  Radiographs: 02/24/2024 -Fracture line still visible to distal fibula.  No displacement appreciated.  Little bone callus appreciated.  Fracture gap approximately 4.5 mm, no significant changes from prior radiographs  Assessment:   1. Closed nondisplaced fracture of lateral malleolus of right fibula with delayed healing, subsequent encounter   2. DM type 2 with diabetic peripheral neuropathy (HCC)      Plan:  Patient was evaluated and treated and all questions answered.  # Distal fibular fracture right with minimal displacement, delayed healing -Emphasized need for compliance with the cam boot as the fracture could lose alignment or it could suffer severe injury with neuropathic fracture -Discussed importance of nicotine and tobacco cessation.  Patient does report using chewing tobacco.  Possible this could have also played a role in the poor  bone healing. -Awaiting bone stimulator, contacting rep -Has seen vascular surgery, nothing to do -Nails debrided as a courtesy today without incident. - Will have patient follow-up in 6 weeks, after receiving bone stimulator, and monitor for any signs of fracture healing    Return in about 6 weeks (around 04/06/2024) for Right Fibula fracture check.         Barbaraann Share, DPM [facility] / [facility]

## 2024-02-25 DIAGNOSIS — S8264XK Nondisplaced fracture of lateral malleolus of right fibula, subsequent encounter for closed fracture with nonunion: Secondary | ICD-10-CM | POA: Diagnosis not present

## 2024-03-25 DIAGNOSIS — G4733 Obstructive sleep apnea (adult) (pediatric): Secondary | ICD-10-CM | POA: Diagnosis not present

## 2024-04-02 DIAGNOSIS — Z85820 Personal history of malignant melanoma of skin: Secondary | ICD-10-CM | POA: Diagnosis not present

## 2024-04-02 DIAGNOSIS — D225 Melanocytic nevi of trunk: Secondary | ICD-10-CM | POA: Diagnosis not present

## 2024-04-02 DIAGNOSIS — D485 Neoplasm of uncertain behavior of skin: Secondary | ICD-10-CM | POA: Diagnosis not present

## 2024-04-02 DIAGNOSIS — D2239 Melanocytic nevi of other parts of face: Secondary | ICD-10-CM | POA: Diagnosis not present

## 2024-04-02 DIAGNOSIS — L570 Actinic keratosis: Secondary | ICD-10-CM | POA: Diagnosis not present

## 2024-04-02 DIAGNOSIS — L578 Other skin changes due to chronic exposure to nonionizing radiation: Secondary | ICD-10-CM | POA: Diagnosis not present

## 2024-04-02 DIAGNOSIS — L820 Inflamed seborrheic keratosis: Secondary | ICD-10-CM | POA: Diagnosis not present

## 2024-04-06 ENCOUNTER — Ambulatory Visit (INDEPENDENT_AMBULATORY_CARE_PROVIDER_SITE_OTHER): Admitting: Podiatry

## 2024-04-06 ENCOUNTER — Ambulatory Visit

## 2024-04-06 DIAGNOSIS — G4733 Obstructive sleep apnea (adult) (pediatric): Secondary | ICD-10-CM | POA: Diagnosis not present

## 2024-04-06 DIAGNOSIS — S8264XG Nondisplaced fracture of lateral malleolus of right fibula, subsequent encounter for closed fracture with delayed healing: Secondary | ICD-10-CM

## 2024-04-06 DIAGNOSIS — E1142 Type 2 diabetes mellitus with diabetic polyneuropathy: Secondary | ICD-10-CM

## 2024-04-06 NOTE — Progress Notes (Signed)
  Subjective:  Patient ID: [name], male    DOB: [birth year],  MRN: [id number]  Chief Complaint  Patient presents with   Fracture    Fib fracture, with the bone stim. Xrays up in tech room. States with the stimulator there is little pain.    68 y.o. male presents with the above complaint. History confirmed with patient. Patient does have a history of T2DM.  He does present ambulating wearing regular shoes, though he is using the bone stimulator today. using cane. Does not complain of significant ankle pain today.  He has been using the bone stimulator for nearly a month at this point which was obtained shortly after his last visit.  Does complain that the cam boot causing some knee hip and back pain to the contralateral side and that he does use at home however he is trying to use the bone stimulator is much as possible for 10 hours a day.  Objective:  Physical Exam: warm, good capillary refill nail exam onychomycosis of the toenails, onycholysis, and dystrophic nails DP pulses non palpable, PT pulses non palpable, and protective sensation absent   Right ankle with mild edema noted to the right ankle laterally no pain on palpation along the fibula.  Radiographs: 04/06/2024 -Fracture line still visible to distal fibula.  There does appear to be some trabeculation across the fracture lines at this point.  The alignment of the fibular fracture is maintained with some slight fibular shortening as seen previously.  There is definite callus formation at this point.  Assessment:   1. Closed nondisplaced fracture of lateral malleolus of right fibula with delayed healing, subsequent encounter   2. DM type 2 with diabetic peripheral neuropathy (HCC)      Plan:  Patient was evaluated and treated and all questions answered.  # Distal fibular fracture right with minimal displacement, delayed healing -Radiographs reviewed with patient showing definite callus formation secondary to use of  the bone stimulator which had not previously been seen.  Encouraged by this. - Emphasized need for compliance with the cam boot as the fracture could lose alignment or it could suffer severe injury with neuropathic fracture -Discussed importance of nicotine and tobacco cessation.  Possible this could have also played a role in the poor bone healing. - Encouraged that there is some progression radiographic findings healing at this point.  Will have him follow-up in 5 to 6 weeks with continued use of the bone stimulator.  Radiographs at follow-up  Of note does report some sciatica-like symptoms to the left side, along with some stiffness and soreness.    Return in about 5 weeks (around 05/11/2024) for Fibula fracture XR.         Reina Cara, DPM [facility] / [facility]

## 2024-04-11 ENCOUNTER — Encounter: Payer: Self-pay | Admitting: Podiatry

## 2024-04-24 DIAGNOSIS — G4733 Obstructive sleep apnea (adult) (pediatric): Secondary | ICD-10-CM | POA: Diagnosis not present

## 2024-04-28 DIAGNOSIS — I2510 Atherosclerotic heart disease of native coronary artery without angina pectoris: Secondary | ICD-10-CM | POA: Diagnosis not present

## 2024-04-28 DIAGNOSIS — I10 Essential (primary) hypertension: Secondary | ICD-10-CM | POA: Diagnosis not present

## 2024-04-28 DIAGNOSIS — E119 Type 2 diabetes mellitus without complications: Secondary | ICD-10-CM | POA: Diagnosis not present

## 2024-04-28 DIAGNOSIS — E7800 Pure hypercholesterolemia, unspecified: Secondary | ICD-10-CM | POA: Diagnosis not present

## 2024-04-28 DIAGNOSIS — G4733 Obstructive sleep apnea (adult) (pediatric): Secondary | ICD-10-CM | POA: Diagnosis not present

## 2024-04-28 DIAGNOSIS — R609 Edema, unspecified: Secondary | ICD-10-CM | POA: Diagnosis not present

## 2024-05-18 ENCOUNTER — Encounter: Payer: Self-pay | Admitting: Podiatry

## 2024-05-18 ENCOUNTER — Ambulatory Visit (INDEPENDENT_AMBULATORY_CARE_PROVIDER_SITE_OTHER): Admitting: Podiatry

## 2024-05-18 ENCOUNTER — Ambulatory Visit (INDEPENDENT_AMBULATORY_CARE_PROVIDER_SITE_OTHER)

## 2024-05-18 DIAGNOSIS — S8264XG Nondisplaced fracture of lateral malleolus of right fibula, subsequent encounter for closed fracture with delayed healing: Secondary | ICD-10-CM

## 2024-05-18 NOTE — Patient Instructions (Signed)
 For instructions on how to put on your Tri-Lock Ankle Brace, please visit [email or web address]   Look for an ASO Ankle brace on [employer] with velcro and laces.  You will likely need an XL or a XXL size '  30 Minute Rule: Begin to transition to more weight in the ankle brace on the right leg with normal daily activities (e.g. going to work or running errands, walking around the house If you have pain and swelling for more than 30 minutes following that activity, it's likely too much too soon and should decrease your distance/activity/weight/time the next time you do it If you have some pain and swelling but doesn't last more than 30 minutes, that activity is OK and you can begin to increase your distance/activity/weight/time the next time you do it Slowly transition out of the cam boot into the ankle brace over the next 2 to 4 weeks.
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
## 2024-05-25 DIAGNOSIS — G4733 Obstructive sleep apnea (adult) (pediatric): Secondary | ICD-10-CM | POA: Diagnosis not present

## 2024-06-15 ENCOUNTER — Ambulatory Visit (INDEPENDENT_AMBULATORY_CARE_PROVIDER_SITE_OTHER)

## 2024-06-15 ENCOUNTER — Ambulatory Visit: Admitting: Podiatry

## 2024-06-15 DIAGNOSIS — S8264XG Nondisplaced fracture of lateral malleolus of right fibula, subsequent encounter for closed fracture with delayed healing: Secondary | ICD-10-CM | POA: Diagnosis not present
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
## 2024-07-13 ENCOUNTER — Ambulatory Visit: Admitting: Podiatry

## 2024-07-13 ENCOUNTER — Ambulatory Visit (INDEPENDENT_AMBULATORY_CARE_PROVIDER_SITE_OTHER)

## 2024-07-13 DIAGNOSIS — M79675 Pain in left toe(s): Secondary | ICD-10-CM

## 2024-07-13 DIAGNOSIS — E1142 Type 2 diabetes mellitus with diabetic polyneuropathy: Secondary | ICD-10-CM

## 2024-07-13 DIAGNOSIS — B351 Tinea unguium: Secondary | ICD-10-CM | POA: Diagnosis not present

## 2024-07-13 DIAGNOSIS — S8264XG Nondisplaced fracture of lateral malleolus of right fibula, subsequent encounter for closed fracture with delayed healing: Secondary | ICD-10-CM | POA: Diagnosis not present

## 2024-07-13 DIAGNOSIS — M79674 Pain in right toe(s): Secondary | ICD-10-CM | POA: Diagnosis not present

## 2024-07-13 NOTE — Progress Notes (Unsigned)
 Subjective:  Patient ID: [name], male    DOB: [birth year],  MRN: [id number]  Chief Complaint  Patient presents with   [facility] and FX Check    Right Fib FX, xrays up. [facility] with out callous. He did takes a fall last week on the right side, he got dizzy. He is worried he reinjured the ankle. He is wearing the bone stim. Last A1c 7.5 in May and takes ASA and plavix .     68 y.o. male presents with the above complaint. History confirmed with patient. Patient presenting with pain related to dystrophic thickened elongated nails. Patient is unable to trim own nails related to nail dystrophy. Patient does have a history of T2DM. He has been wearing bone stimulator for right fibular fracture which had been slow to heal but appeared to have progressed well at the time of last visit. He reports recently having a fall last week and had dealt with some soreness to the inside of his right ankle but thinks it is doing better now.  Pertaining to the fall, he has reported feeing dizzy and light headed when standing from seated position and that this has been happening more frequently. I advised that this is could be orthostatic hypotension and that he should see his cardiologist or PCP for further evaluation on this urgently.  Objective:  Physical Exam: warm, good capillary refill, decreased pedal hair growth nail exam onychomycosis of the toenails and dystrophic nails DP pulses palpable, PT pulses palpable, and protective sensation absent, bilateral +1 pitting edema to lower extremities noted. Left Foot:  Pain with palpation of nails due to elongation and dystrophic growth.  Right Foot: Pain with palpation of nails due to elongation and dystrophic growth. No pain on palpation of right fibula or medial ankle, other ankle structures. No pain with right ankle or rearfoot range of motion.  Right ankle 3 views weightbearing 07/13/24 - There is good trabeculation noted across the fracture sites.  Evidence of good  fracture healing.  Fracture lines still faintly visible though there appears to be good bone formation noted across the sites.   Assessment:   1. Closed nondisplaced fracture of lateral malleolus of right fibula with delayed healing, subsequent encounter   2. Pain due to onychomycosis of toenails of both feet   3. DM type 2 with diabetic peripheral neuropathy (HCC)      Plan:  Patient was evaluated and treated and all questions answered.  #Onychomycosis with pain  -Nails palliatively debrided as below. -Educated on self-care  Procedure: Nail Debridement Rationale: Pain Type of Debridement: manual, sharp debridement. Instrumentation: Nail nipper, rotary burr. Number of Nails: 10  #Diabetes with neuropathy Patient educated on diabetes. Discussed proper diabetic foot care and discussed risks and complications of disease. Educated patient in depth on reasons to return to the office immediately should he/she discover anything concerning or new on the feet. All questions answered. Discussed proper shoes as well.   #Right fibula fracture with delayed healing -Radiographs reviewed with patient -Does appear to have progressed well and appears healed -He can continue with the bone stimulator for the time being as fracture lines are visible, but can otherwise weight bear as tolerated avoiding high impact activity -Continue with tobacco and nicotine cessation -I certify that this diagnosis represents a distinct and separate diagnosis that requires evaluation and treatment separate from other procedures or diagnosis   Discussed fall risk with patient and encouraged that he get further workup for possible orthostatic hypotension. If he  has an episode like this again he should go to the hospital.  Return in about 3 months (around 10/13/2024) for Diabetic Foot Care.         Ethan Saddler, DPM [facility] / [facility]

## 2024-08-04 DIAGNOSIS — G4733 Obstructive sleep apnea (adult) (pediatric): Secondary | ICD-10-CM | POA: Diagnosis not present

## 2024-08-04 DIAGNOSIS — E7800 Pure hypercholesterolemia, unspecified: Secondary | ICD-10-CM | POA: Diagnosis not present

## 2024-08-04 DIAGNOSIS — R609 Edema, unspecified: Secondary | ICD-10-CM | POA: Diagnosis not present

## 2024-08-04 DIAGNOSIS — E119 Type 2 diabetes mellitus without complications: Secondary | ICD-10-CM | POA: Diagnosis not present

## 2024-08-04 DIAGNOSIS — I10 Essential (primary) hypertension: Secondary | ICD-10-CM | POA: Diagnosis not present

## 2024-08-04 DIAGNOSIS — I2510 Atherosclerotic heart disease of native coronary artery without angina pectoris: Secondary | ICD-10-CM | POA: Diagnosis not present

## 2024-08-04 DIAGNOSIS — R42 Dizziness and giddiness: Secondary | ICD-10-CM | POA: Diagnosis not present

## 2024-08-04 DIAGNOSIS — Z139 Encounter for screening, unspecified: Secondary | ICD-10-CM | POA: Diagnosis not present

## 2024-08-19 ENCOUNTER — Ambulatory Visit: Payer: Medicare Other | Admitting: Cardiology

## 2024-08-24 ENCOUNTER — Other Ambulatory Visit (HOSPITAL_COMMUNITY): Payer: Self-pay

## 2024-08-24 ENCOUNTER — Encounter: Payer: Self-pay | Admitting: Cardiology

## 2024-08-24 ENCOUNTER — Ambulatory Visit: Attending: Cardiology | Admitting: Cardiology

## 2024-08-24 VITALS — BP 120/76 | HR 59 | Resp 16 | Ht 76.0 in | Wt 313.6 lb

## 2024-08-24 DIAGNOSIS — E1149 Type 2 diabetes mellitus with other diabetic neurological complication: Secondary | ICD-10-CM

## 2024-08-24 DIAGNOSIS — I10 Essential (primary) hypertension: Secondary | ICD-10-CM

## 2024-08-24 DIAGNOSIS — I951 Orthostatic hypotension: Secondary | ICD-10-CM

## 2024-08-24 DIAGNOSIS — I2510 Atherosclerotic heart disease of native coronary artery without angina pectoris: Secondary | ICD-10-CM

## 2024-08-24 DIAGNOSIS — E7800 Pure hypercholesterolemia, unspecified: Secondary | ICD-10-CM | POA: Diagnosis not present

## 2024-08-24 MED ORDER — MOUNJARO 2.5 MG/0.5ML ~~LOC~~ SOAJ
2.5000 mg | SUBCUTANEOUS | 0 refills | Status: AC
  Filled 2024-08-24: qty 2, 28d supply, fill #0

## 2024-08-24 NOTE — Progress Notes (Signed)
 Cardiology Office Note:  .   Date:  08/24/2024  ID:  [name], DOB 1956/06/11, MRN [id number] PCP: Montey Lot, PA-C  [facility] Providers Cardiologist:  Gordy Bergamo, MD   History of Present Illness: .   [name] is a 68 y.o. Caucasian male with known coronary artery disease with history of stenting to D1 branch of LAD on 08/21/2015 with DES, has moderate diffuse disease in other vessels. His past medical history includes uncontrolled diabetes mellitus with peripheral neuropathy, retinopathy, hyperlipidemia, hypertension, obesity and tobacco use disorder in the form of chewing tobacco.    He remains asymptomatic without chest pain or dyspnea however his physical activity is limited.  His main complaint today is marked dizziness and episodes of near syncope especially when he stands up.  Cardiac Studies relevent.     Echocardiogram 08/28/2021: Normal LV systolic function with visual EF 55-60%. Le  Coronary Angiography for NSTEMI anterolateral wall on 08/21/2015:  S/P PTCA and stenting of D1 branch of LAD with implantation of a 2.5 x 24 mm Synergy DES.       Discussed the use of AI scribe software for clinical note transcription with the patient, who gave verbal consent to proceed.  History of Present Illness [name] is a 68 year old male with diabetes who presents with episodes of fainting and dizziness.  He experiences episodes of fainting and dizziness, initially leading to a fall and broken ankle last October. He attributes these episodes to dehydration and low potassium. Recently, similar episodes have recurred intermittently. Reducing gabapentin  dosage alleviated dizziness, but he resumed it due to severe nocturnal foot pain. Dizziness occurs upon waking and standing.  He manages diabetes with metformin . He previously used Ozempic, which caused significant nausea and vomiting, leading to discontinuation despite weight loss and improved A1c.  His A1c is now 7.5.  He has a family history of heart disease, with his younger brother recently dying from a heart attack. Current medications include gabapentin , metformin , carvedilol , Lasix, aspirin , and clopidogrel .   Labs   Care everywhere/Faxed External Labs:  Labs 08/05/2024:  Hb 14.4/HCT 45.7, platelets 243.  Serum glucose 138, BUN 18, creatinine 1.24, eGFR 64 mL, potassium 4.3, LFTs normal.  Total cholesterol 113, triglycerides 122, HDL 48, LDL 43.  Urinary albumin to creatinine ratio 13.0.  Vitamin D  47.  ROS  Review of Systems  Cardiovascular:  Positive for near-syncope. Negative for chest pain, dyspnea on exertion and leg swelling.  Neurological:  Positive for dizziness.   Physical Exam:   VS:  BP 120/76 (Patient Position: Standing, Cuff Size: Large)   Pulse (!) 59   Resp 16   Ht 6' 4 (1.93 m)   Wt (!) 313 lb 9.6 oz (142.2 kg)   SpO2 94%   BMI 38.17 kg/m    Wt Readings from Last 3 Encounters:  08/24/24 (!) 313 lb 9.6 oz (142.2 kg)  02/18/24 (!) 306 lb (138.8 kg)  09/01/23 (!) 320 lb 9.6 oz (145.4 kg)    BP Readings from Last 3 Encounters:  08/24/24 120/76  02/18/24 (!) 142/86  11/17/23 135/81   Orthostatic Vitals for the past 48 hrs (Last 6 readings):  Patient Position Orthostatic BP BP Pulse BP Location Cuff Size  08/24/24 1523 Sitting -- 134/72 (!) 59 Left Arm Large  08/24/24 1554 Sitting 136/80 -- -- Left Arm --  08/24/24 1555 Standing 136/80 -- -- Left Arm --  08/24/24 1556 Standing -- 124/76 -- Left Arm --  08/24/24 1558 Sitting 138/80 -- -- Left Arm Large  08/24/24 1559 Standing -- 120/76 -- -- Large    Physical Exam Constitutional:      Appearance: He is morbidly obese.  Neck:     Vascular: No carotid bruit or JVD.  Cardiovascular:     Rate and Rhythm: Normal rate and regular rhythm.     Pulses: Intact distal pulses.     Heart sounds: Normal heart sounds. No murmur heard.    No gallop.  Pulmonary:     Effort: Pulmonary effort is  normal.     Breath sounds: Normal breath sounds.  Abdominal:     General: Bowel sounds are normal.     Palpations: Abdomen is soft.  Musculoskeletal:     Right lower leg: No edema.     Left lower leg: No edema.    EKG:    EKG Interpretation Date/Time:  Tuesday August 24 2024 15:23:14 EDT Ventricular Rate:  53 PR Interval:  226 QRS Duration:  94 QT Interval:  444 QTC Calculation: 416 R Axis:   -35  Text Interpretation: EKG 08/24/2024: Sinus rhythm sinus bradycardia at rate of 53 bpm with first-degree AV block.  Left anterior fascicular block.  Poor R wave progression, cannot exclude anteroseptal infarct old.  Minimal criteria for LVH in aVL.  No significant change from 07/03/2022. Confirmed by Erikka Follmer, Jagadeesh ([id number]) on 08/24/2024 3:34:53 PM    ASSESSMENT AND PLAN: .      ICD-10-CM   1. Coronary artery disease involving native coronary artery of native heart without angina pectoris  I25.10 EKG 12-Lead    2. Essential hypertension  I10     3. Pure hypercholesterolemia  E78.00     4. Orthostatic hypotension  I95.1     5. Type 2 diabetes mellitus with other neurologic complication, without long-term current use of insulin  (HCC)  E11.49      Assessment & Plan Orthostatic hypotension His episodes of dizziness and near syncope related to orthostatic hypotension likely due to autonomic dysfunction secondary to diabetes, with significant blood pressure drop upon standing, causing dizziness and falls. Current medications, including carvedilol  and tamsulosin, may exacerbate hypotension.  I did not check his supine blood pressure and even with sitting blood pressure, there is significant change in his blood pressure with standing at 3 minutes dropping blood pressure by about 15 mmHg. - Instruct to monitor blood pressure in supine, sitting, and standing positions for one week and report results - Stop carvedilol  due to orthostatic hypotension concerns, except mild hypertension. - Stop  aspirin  - Consider alternative dosing for tamsulosin, such as every other day - Advised patient to monitor his blood pressure orthostatics at home and to take it to his PCP.  Type 2 diabetes mellitus Type 2 diabetes mellitus with recent A1c of 7.5. Previous use of Ozempic resulted in significant weight loss but was discontinued due to severe nausea and vomiting. Current management includes metformin . Discussed potential benefits of GLP-1 agonists for weight loss and glycemic control. Mounjaro , a GLP-1 agonist, is recommended as an alternative to Ozempic, with instructions on managing potential side effects such as nausea by eating slowly and avoiding oily foods. - Start Mounjaro  at 2.5 mg with potential to increase to 15 mg - Instruct to contact primary care physician to escalate Mounjaro  dose if tolerated - Consider Jardiance for additional diabetes management - Extensive discussion with the patient regarding side effects when she had discontinued taking Ozempic previously probably related to excess food intake and  probably habit of eating fast. - If he tolerates 2.5 mg, he will follow-up with his PCP for further future refills and escalation of the dose.  Atherosclerotic heart disease of native coronary artery without angina Atherosclerotic heart disease without current angina symptoms. No acute cardiac issues. - CAD stable, last coronary intervention in 2016.  He is angina free and no EKG changes. - Continue long-term Plavix , discontinue aspirin  to reduce bleeding risk.  Hypercholesterolemia Reviewed external labs, LDL is at goal at 43.  Continue Crestor  40 mg daily.   Follow up: As needed from cardiac standpoint.  I am extremely concerned about his diabetes and multiple diabetic complications.  Signed,  Gordy Bergamo, MD, [facility] 08/24/2024, 9:10 PM [facility] [address] [place], [place] [address] Phone: [phone]. Fax:  [phone]

## 2024-08-24 NOTE — Patient Instructions (Addendum)
 Medication Instructions:  STOP Carvedilol   STOP Aspirin   START Mounjaro  2.5 mg/0.5 mL Pen  Inject 2.5 mg into the skin once a week   *If you need a refill on your cardiac medications before your next appointment, please call your pharmacy*  Lab Work: NONE If you have labs (blood work) drawn today and your tests are completely normal, you will receive your results only by: MyChart Message (if you have MyChart) OR A paper copy in the mail If you have any lab test that is abnormal or we need to change your treatment, we will call you to review the results.  Testing/Procedures: NONE  Follow-Up: At [facility], you and your health needs are our priority.  As part of our continuing mission to provide you with exceptional heart care, our providers are all part of one team.  This team includes your primary Cardiologist (physician) and Advanced Practice Providers or APPs (Physician Assistants and Nurse Practitioners) who all work together to provide you with the care you need, when you need it.  Your next appointment:   AS NEEDED   Provider:   Gordy Bergamo, MD    We recommend signing up for the patient portal called MyChart.  Sign up information is provided on this After Visit Summary.  MyChart is used to connect with patients for Virtual Visits (Telemedicine).  Patients are able to view lab/test results, encounter notes, upcoming appointments, etc.  Non-urgent messages can be sent to your provider as well.   To learn more about what you can do with MyChart, go to [email or web address].   OTHER:  Call primary care provider for refills for Mounjaro  and follow up

## 2024-08-31 ENCOUNTER — Other Ambulatory Visit: Payer: Self-pay | Admitting: Cardiology

## 2024-08-31 DIAGNOSIS — E7800 Pure hypercholesterolemia, unspecified: Secondary | ICD-10-CM

## 2024-09-13 ENCOUNTER — Other Ambulatory Visit: Payer: Self-pay | Admitting: Cardiology

## 2024-09-13 DIAGNOSIS — I739 Peripheral vascular disease, unspecified: Secondary | ICD-10-CM

## 2024-09-13 DIAGNOSIS — L98491 Non-pressure chronic ulcer of skin of other sites limited to breakdown of skin: Secondary | ICD-10-CM

## 2024-09-17 ENCOUNTER — Other Ambulatory Visit: Payer: Self-pay

## 2024-09-17 DIAGNOSIS — I739 Peripheral vascular disease, unspecified: Secondary | ICD-10-CM

## 2024-09-17 DIAGNOSIS — L98491 Non-pressure chronic ulcer of skin of other sites limited to breakdown of skin: Secondary | ICD-10-CM

## 2024-09-17 MED ORDER — CLOPIDOGREL BISULFATE 75 MG PO TABS: 75.0000 mg | ORAL_TABLET | Freq: Every day | ORAL | 3 refills | Status: AC

## 2024-10-08 DIAGNOSIS — D485 Neoplasm of uncertain behavior of skin: Secondary | ICD-10-CM | POA: Diagnosis not present

## 2024-10-08 DIAGNOSIS — Z85820 Personal history of malignant melanoma of skin: Secondary | ICD-10-CM | POA: Diagnosis not present

## 2024-10-08 DIAGNOSIS — D2239 Melanocytic nevi of other parts of face: Secondary | ICD-10-CM | POA: Diagnosis not present

## 2024-10-08 DIAGNOSIS — D225 Melanocytic nevi of trunk: Secondary | ICD-10-CM | POA: Diagnosis not present

## 2024-10-08 DIAGNOSIS — L578 Other skin changes due to chronic exposure to nonionizing radiation: Secondary | ICD-10-CM | POA: Diagnosis not present

## 2024-10-08 DIAGNOSIS — L570 Actinic keratosis: Secondary | ICD-10-CM | POA: Diagnosis not present

## 2024-10-08 DIAGNOSIS — L820 Inflamed seborrheic keratosis: Secondary | ICD-10-CM | POA: Diagnosis not present

## 2024-10-19 ENCOUNTER — Ambulatory Visit (INDEPENDENT_AMBULATORY_CARE_PROVIDER_SITE_OTHER): Admitting: Podiatry

## 2024-10-19 ENCOUNTER — Encounter: Payer: Self-pay | Admitting: Podiatry

## 2024-10-19 ENCOUNTER — Ambulatory Visit (INDEPENDENT_AMBULATORY_CARE_PROVIDER_SITE_OTHER)

## 2024-10-19 DIAGNOSIS — M79674 Pain in right toe(s): Secondary | ICD-10-CM

## 2024-10-19 DIAGNOSIS — S93491A Sprain of other ligament of right ankle, initial encounter: Secondary | ICD-10-CM | POA: Diagnosis not present

## 2024-10-19 DIAGNOSIS — M79675 Pain in left toe(s): Secondary | ICD-10-CM

## 2024-10-19 DIAGNOSIS — E1142 Type 2 diabetes mellitus with diabetic polyneuropathy: Secondary | ICD-10-CM | POA: Diagnosis not present

## 2024-10-19 DIAGNOSIS — M25571 Pain in right ankle and joints of right foot: Secondary | ICD-10-CM

## 2024-10-19 DIAGNOSIS — B351 Tinea unguium: Secondary | ICD-10-CM
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
## 2024-11-16 ENCOUNTER — Ambulatory Visit: Admitting: Podiatry

## 2025-01-18 ENCOUNTER — Ambulatory Visit (INDEPENDENT_AMBULATORY_CARE_PROVIDER_SITE_OTHER): Admitting: Podiatry

## 2025-01-18 ENCOUNTER — Encounter: Payer: Self-pay | Admitting: Podiatry

## 2025-01-18 DIAGNOSIS — B351 Tinea unguium: Secondary | ICD-10-CM | POA: Diagnosis not present

## 2025-01-18 DIAGNOSIS — E1142 Type 2 diabetes mellitus with diabetic polyneuropathy: Secondary | ICD-10-CM

## 2025-01-18 DIAGNOSIS — M79674 Pain in right toe(s): Secondary | ICD-10-CM | POA: Diagnosis not present

## 2025-01-18 DIAGNOSIS — M25373 Other instability, unspecified ankle: Secondary | ICD-10-CM

## 2025-01-18 DIAGNOSIS — M79675 Pain in left toe(s): Secondary | ICD-10-CM

## 2025-01-18 DIAGNOSIS — I739 Peripheral vascular disease, unspecified: Secondary | ICD-10-CM

## 2025-01-18 NOTE — Progress Notes (Unsigned)
 Nails today.  A1c doing better  Right ankle pain resolved but does relate that he feels it is unstable and wants to get out sometime also dealing with concomitant right ankle pain.  Some concern for ankle instability, subtle increase in talar tilt versus left side  Will have him follow-up as needed for this issue and discussed benefits of PT once he gets insurance situation figured out as he is switching insurances.  Him exercises that he can work on

## 2025-04-19 ENCOUNTER — Ambulatory Visit: Admitting: Podiatry
